# Patient Record
Sex: Female | Born: 1955
Health system: Southern US, Community
[De-identification: ages and names within clinical notes are randomized; demographics above are authoritative.]

## PROBLEM LIST (undated history)

## (undated) DIAGNOSIS — Z79890 Hormone replacement therapy (postmenopausal): Secondary | ICD-10-CM

## (undated) HISTORY — DX: Hormone replacement therapy: Z79.890

## (undated) HISTORY — PX: COLONOSCOPY: SHX174

---

## 2000-08-18 ENCOUNTER — Other Ambulatory Visit: Admission: RE | Admit: 2000-08-18 | Discharge: 2000-08-18 | Payer: Self-pay | Admitting: Obstetrics and Gynecology

## 2001-08-14 ENCOUNTER — Ambulatory Visit (HOSPITAL_COMMUNITY): Admission: RE | Admit: 2001-08-14 | Discharge: 2001-08-14 | Payer: Self-pay | Admitting: Obstetrics and Gynecology

## 2001-08-14 ENCOUNTER — Encounter: Payer: Self-pay | Admitting: Obstetrics and Gynecology

## 2003-02-24 ENCOUNTER — Ambulatory Visit (HOSPITAL_COMMUNITY): Admission: RE | Admit: 2003-02-24 | Discharge: 2003-02-24 | Payer: Self-pay | Admitting: Obstetrics and Gynecology

## 2004-07-15 ENCOUNTER — Ambulatory Visit (HOSPITAL_COMMUNITY): Admission: RE | Admit: 2004-07-15 | Discharge: 2004-07-15 | Payer: Self-pay | Admitting: Obstetrics and Gynecology

## 2005-07-13 ENCOUNTER — Ambulatory Visit (HOSPITAL_COMMUNITY): Admission: RE | Admit: 2005-07-13 | Discharge: 2005-07-13 | Payer: Self-pay | Admitting: Internal Medicine

## 2005-07-13 ENCOUNTER — Ambulatory Visit: Payer: Self-pay | Admitting: Internal Medicine

## 2005-07-19 ENCOUNTER — Ambulatory Visit (HOSPITAL_COMMUNITY): Admission: RE | Admit: 2005-07-19 | Discharge: 2005-07-19 | Payer: Self-pay | Admitting: Obstetrics and Gynecology

## 2006-08-10 ENCOUNTER — Ambulatory Visit (HOSPITAL_COMMUNITY): Admission: RE | Admit: 2006-08-10 | Discharge: 2006-08-10 | Payer: Self-pay | Admitting: Obstetrics and Gynecology

## 2007-08-08 ENCOUNTER — Other Ambulatory Visit: Admission: RE | Admit: 2007-08-08 | Discharge: 2007-08-08 | Payer: Self-pay | Admitting: Obstetrics and Gynecology

## 2007-08-13 ENCOUNTER — Ambulatory Visit (HOSPITAL_COMMUNITY): Admission: RE | Admit: 2007-08-13 | Discharge: 2007-08-13 | Payer: Self-pay | Admitting: Obstetrics and Gynecology

## 2008-08-13 ENCOUNTER — Ambulatory Visit (HOSPITAL_COMMUNITY): Admission: RE | Admit: 2008-08-13 | Discharge: 2008-08-13 | Payer: Self-pay | Admitting: Obstetrics and Gynecology

## 2008-09-11 ENCOUNTER — Other Ambulatory Visit: Admission: RE | Admit: 2008-09-11 | Discharge: 2008-09-11 | Payer: Self-pay | Admitting: Obstetrics and Gynecology

## 2009-08-24 ENCOUNTER — Ambulatory Visit (HOSPITAL_COMMUNITY): Admission: RE | Admit: 2009-08-24 | Discharge: 2009-08-24 | Payer: Self-pay | Admitting: Obstetrics and Gynecology

## 2009-11-06 ENCOUNTER — Other Ambulatory Visit: Admission: RE | Admit: 2009-11-06 | Discharge: 2009-11-06 | Payer: Self-pay | Admitting: Obstetrics and Gynecology

## 2010-06-11 NOTE — Op Note (Signed)
NAME:  Whitney Alvarez, Whitney Alvarez               ACCOUNT NO.:  [id number]   MEDICAL RECORD NO.:  [id number]          PATIENT TYPE:  AMB   LOCATION:  DAY                           FACILITY:  [facility]   PHYSICIAN:  Lionel December, M.D.    DATE OF BIRTH:  May 31, 1955   DATE OF PROCEDURE:  07/13/2005  DATE OF DISCHARGE:                                 OPERATIVE REPORT   PROCEDURE:  Colonoscopy.   INDICATIONS:  Cindy is a 55 year old Caucasian female who is undergoing  screening colonoscopy.  Family history is negative for colon carcinoma in  first-degree relatives.  The procedure risks were reviewed the patient,  informed consent was obtained.   MEDICATIONS FOR CONSCIOUS SEDATION:  Demerol 25 mg IV, Versed 5 mg IV.   FINDINGS:  Procedure performed in endoscopy suite.  The patient's vital  signs and O2 saturation were monitored during the procedure and remained  stable.  The patient was placed in the left lateral recumbent position.  Rectal examination was performed.  No abnormality noted on external or  digital exam.  The Olympus video scope was placed in the rectum and advanced  under vision into sigmoid colon and beyond.  Preparation was excellent.  The  scope was passed into cecum, which was identified by appendiceal orifice and  ileocecal valve.  Pictures taken for the record.  As the scope was  withdrawn, colonic mucosa was once again carefully examined and was normal  throughout.  Rectal mucosa similarly was normal.  The scope was retroflexed  to examine anorectal junction and small hemorrhoids were noted below the  dentate line.  Endoscope was straightened and withdrawn.  The patient  tolerated the procedure well.   FINAL DIAGNOSIS:  Small external hemorrhoids, otherwise normal colonoscopy.   RECOMMENDATIONS:  1.  Yearly Hemoccults.  2.  She may consider next screening exam in 10 years from now.      Lionel December, M.D.  Electronically Signed     NR/MEDQ  D:  07/13/2005  T:   07/13/2005  Job:  [id number]   cc:   Patrica Duel, M.D.  Fax: [phone]

## 2011-02-17 ENCOUNTER — Other Ambulatory Visit (HOSPITAL_COMMUNITY)
Admission: RE | Admit: 2011-02-17 | Discharge: 2011-02-17 | Disposition: A | Discharge status: Home / Self Care | Payer: BC Managed Care – PPO | Source: Ambulatory Visit | Attending: Obstetrics and Gynecology | Admitting: Obstetrics and Gynecology

## 2011-02-17 ENCOUNTER — Other Ambulatory Visit: Payer: Self-pay | Admitting: Adult Health

## 2011-02-17 DIAGNOSIS — Z01419 Encounter for gynecological examination (general) (routine) without abnormal findings: Secondary | ICD-10-CM | POA: Insufficient documentation

## 2011-04-25 ENCOUNTER — Other Ambulatory Visit (HOSPITAL_COMMUNITY): Payer: Self-pay | Admitting: Internal Medicine

## 2011-04-25 DIAGNOSIS — Z139 Encounter for screening, unspecified: Secondary | ICD-10-CM

## 2011-04-28 ENCOUNTER — Ambulatory Visit (HOSPITAL_COMMUNITY)
Admission: RE | Admit: 2011-04-28 | Discharge: 2011-04-28 | Disposition: A | Discharge status: Home / Self Care | Payer: BC Managed Care – PPO | Source: Ambulatory Visit | Attending: Internal Medicine | Admitting: Internal Medicine

## 2011-04-28 DIAGNOSIS — Z1231 Encounter for screening mammogram for malignant neoplasm of breast: Secondary | ICD-10-CM | POA: Insufficient documentation

## 2011-04-28 DIAGNOSIS — Z139 Encounter for screening, unspecified: Secondary | ICD-10-CM

## 2012-03-01 ENCOUNTER — Other Ambulatory Visit: Payer: Self-pay | Admitting: Adult Health

## 2012-03-01 ENCOUNTER — Other Ambulatory Visit (HOSPITAL_COMMUNITY)
Admission: RE | Admit: 2012-03-01 | Discharge: 2012-03-01 | Disposition: A | Discharge status: Home / Self Care | Payer: BC Managed Care – PPO | Source: Ambulatory Visit | Attending: Obstetrics and Gynecology | Admitting: Obstetrics and Gynecology

## 2012-03-01 DIAGNOSIS — Z1151 Encounter for screening for human papillomavirus (HPV): Secondary | ICD-10-CM | POA: Insufficient documentation

## 2012-03-01 DIAGNOSIS — Z01419 Encounter for gynecological examination (general) (routine) without abnormal findings: Secondary | ICD-10-CM | POA: Insufficient documentation

## 2012-05-24 ENCOUNTER — Other Ambulatory Visit (HOSPITAL_COMMUNITY): Payer: Self-pay | Admitting: Internal Medicine

## 2012-05-24 ENCOUNTER — Ambulatory Visit (HOSPITAL_COMMUNITY)
Admission: RE | Admit: 2012-05-24 | Discharge: 2012-05-24 | Disposition: A | Discharge status: Home / Self Care | Payer: BC Managed Care – PPO | Source: Ambulatory Visit | Attending: Internal Medicine | Admitting: Internal Medicine

## 2012-05-24 DIAGNOSIS — W208XXA Other cause of strike by thrown, projected or falling object, initial encounter: Secondary | ICD-10-CM | POA: Insufficient documentation

## 2012-05-24 DIAGNOSIS — R52 Pain, unspecified: Secondary | ICD-10-CM

## 2012-05-24 DIAGNOSIS — M79609 Pain in unspecified limb: Secondary | ICD-10-CM | POA: Insufficient documentation

## 2012-05-24 DIAGNOSIS — S99929A Unspecified injury of unspecified foot, initial encounter: Secondary | ICD-10-CM | POA: Insufficient documentation

## 2012-05-24 DIAGNOSIS — S8990XA Unspecified injury of unspecified lower leg, initial encounter: Secondary | ICD-10-CM | POA: Insufficient documentation

## 2012-11-08 ENCOUNTER — Other Ambulatory Visit: Payer: Self-pay | Admitting: Podiatry

## 2012-11-08 NOTE — Addendum Note (Signed)
Addended by: Ferman Hamming on: 11/08/2012 11:37 AM   Modules accepted: Orders

## 2012-12-04 ENCOUNTER — Encounter (HOSPITAL_COMMUNITY): Payer: Self-pay

## 2012-12-04 ENCOUNTER — Ambulatory Visit (HOSPITAL_COMMUNITY)
Admission: RE | Admit: 2012-12-04 | Discharge: 2012-12-04 | Disposition: A | Discharge status: Home / Self Care | Payer: BC Managed Care – PPO | Source: Ambulatory Visit | Attending: Podiatry | Admitting: Podiatry

## 2012-12-04 ENCOUNTER — Encounter (HOSPITAL_COMMUNITY)
Admission: RE | Admit: 2012-12-04 | Discharge: 2012-12-04 | Disposition: A | Discharge status: Home / Self Care | Payer: BC Managed Care – PPO | Source: Ambulatory Visit | Attending: Podiatry | Admitting: Podiatry

## 2012-12-04 ENCOUNTER — Encounter (HOSPITAL_COMMUNITY): Payer: Self-pay | Admitting: Pharmacy Technician

## 2012-12-04 DIAGNOSIS — M2010 Hallux valgus (acquired), unspecified foot: Secondary | ICD-10-CM | POA: Insufficient documentation

## 2012-12-04 DIAGNOSIS — Z01818 Encounter for other preprocedural examination: Secondary | ICD-10-CM | POA: Insufficient documentation

## 2012-12-04 DIAGNOSIS — Z01812 Encounter for preprocedural laboratory examination: Secondary | ICD-10-CM | POA: Insufficient documentation

## 2012-12-04 LAB — BASIC METABOLIC PANEL
BUN: 15 mg/dL (ref 6–23)
Chloride: 105 mEq/L (ref 96–112)
GFR calc Af Amer: >90 mL/min (ref >90)
GFR calc non Af Amer: >90 mL/min (ref >90)
Potassium: 4.5 mEq/L (ref 3.5–5.1)

## 2012-12-04 LAB — HEMOGLOBIN AND HEMATOCRIT, BLOOD
HCT: 38.2 % (ref 36.0–46.0)
Hemoglobin: 13.1 g/dL (ref 12.0–15.0)

## 2012-12-04 NOTE — Patient Instructions (Signed)
[facility]  12/04/2012   Your procedure is scheduled on:  12/13/2012  Report to [facility] at  615  AM.  Call this number if you have problems the morning of surgery: [phone]   Remember:   Do not eat food or drink liquids after midnight.   Take these medicines the morning of surgery with A SIP OF WATER: none   Do not wear jewelry, make-up or nail polish.  Do not wear lotions, powders, or perfumes.   Do not shave 48 hours prior to surgery. Men may shave face and neck.  Do not bring valuables to the hospital.  [facility] is not responsible for any belongings or valuables.               Contacts, dentures or bridgework may not be worn into surgery.  Leave suitcase in the car. After surgery it may be brought to your room.  For patients admitted to the hospital, discharge time is determined by your treatment team.               Patients discharged the day of surgery will not be allowed to drive home.  Name and phone number of your driver: family  Special Instructions: Shower using CHG 2 nights before surgery and the night before surgery.  If you shower the day of surgery use CHG.  Use special wash - you have one bottle of CHG for all showers.  You should use approximately 1/3 of the bottle for each shower.   Please read over the following fact sheets that you were given: Pain Booklet, Coughing and Deep Breathing, Surgical Site Infection Prevention, Anesthesia Post-op Instructions and Care and Recovery After Surgery Bunionectomy A bunionectomy is surgery to remove a bunion. A bunion is an enlargement of the joint at the base of the big toe. It is made up of bone and soft tissue on the inside part of the joint. Over time, a painful lump appears on the inside of the joint. The big toe begins to point inward toward the second toe. New bone growth can occur and a bone spur may form. The pain eventually causes difficulty walking. A bunion usually results from inflammation  caused by the irritation of poorly fitting shoes. It often begins later in life. A bunionectomy is performed when nonsurgical treatment no longer works. When surgery is needed, the extent of the procedure will depend on the degree of deformity of the foot. Your surgeon will discuss with you the different procedures and what will work best for you depending on your age and health. LET YOUR CAREGIVER KNOW ABOUT:   Previous problems with anesthetics or medicines used to numb the skin.  Allergies to dyes, iodine, foods, and/or latex.  Medicines taken including herbs, eye drops, prescription medicines (especially medicines used to "thin the blood"), aspirin and other over-the-counter medicines, and steroids (by mouth or as a cream).  History of bleeding or blood problems.  Possibility of pregnancy, if this applies.  History of blood clots in your legs and/or lungs .  Previous surgery.  Other important health problems. RISKS AND COMPLICATIONS   Infection.  Pain.  Nerve damage.  Possibility that the bunion will recur. BEFORE THE PROCEDURE  You should be present 60 minutes prior to your procedure or as directed.  PROCEDURE  Surgery is often done so that you can go home the same day (outpatient). It may be done in a hospital or in an  outpatient surgical center. An anesthetic will be used to help you sleep during the procedure. Sometimes, a spinal anesthetic is used to make you numb below the waist. A cut (incision) is made over the swollen area at the first joint of the big toe. The enlarged lump will be removed. If there is a need to reposition the bones of the big toe, this may require more than 1 incision. The bone itself may need to be cut. Screws and wires may be used in the repair. These can be removed at a later date. In severe cases, the entire joint may need to be removed and a joint replacement inserted. When done, the incision is closed with stitches (sutures). Skin adhesive strips  may be added for reinforcement. They help hold the incision closed.  AFTER THE PROCEDURE  Compression bandages (dressings) are then wrapped around the wound. This helps to keep the foot in alignment and reduce swelling. Your foot will be monitored for bleeding and swelling. You will need to stay for a few hours in the recovery area before being discharged. This allows time for the anesthesia to wear off. You will be discharged home when you are awake, stable, and doing well. HOME CARE INSTRUCTIONS   You can expect to return to normal activities within 6 to 8 weeks after surgery. The foot is at increased risk for swelling for several months. When you can expect to bear weight on the operated foot will depend on the extent of your surgery. The milder the deformity, the less tissue is removed and the sooner the return to normal activity level. During the recovery period, a special shoe, boot, or cast may be worn to accommodate the surgical bandage and to help provide stability to the foot.  Once you are home, an ice pack applied to the operative site may help with discomfort and keep swelling down. Stop using the ice if it causes discomfort.  Keep your feet raised (elevated) when possible to lessen swelling.  If you have an elastic bandage on your foot and you have numbness, tingling, or your foot becomes cold and blue, adjust the bandage to make it comfortable.  Change dressings as directed.  Keep the wound dry and clean. The wound may be washed gently with soap and water. Gently blot dry without rubbing. Do not take baths or use swimming pools or hot tubs for 10 days, or as instructed by your caregiver.  Only take over-the-counter or prescription medicines for pain, discomfort, or fever as directed by your caregiver.  You may continue a normal diet as directed.  For activity, use crutches with no weight bearing or your orthopedic shoe as directed. Continue to use crutches or a cane as directed  until you can stand without causing pain. SEEK MEDICAL CARE IF:   You have redness, swelling, bruising, or increasing pain in the wound.  There is pus coming from the wound.  You have drainage from a wound lasting longer than 1 day.  You have an oral temperature above 102 F (38.9 C).  You notice a bad smell coming from the wound or dressing.  The wound breaks open after sutures have been removed.  You develop dizzy episodes or fainting while standing.  You have persistent nausea or vomiting.  Your toes become cold.  Pain is not relieved with medicines. SEEK IMMEDIATE MEDICAL CARE IF:   You develop a rash.  You have difficulty breathing.  You develop any reaction or side effects to medicines  given.  Your toes are numb or blue, or you have severe pain. MAKE SURE YOU:   Understand these instructions.  Will watch your condition.  Will get help right away if you are not doing well or get worse. Document Released: 12/24/2004 Document Revised: 04/04/2011 Document Reviewed: 01/29/2007 [facility] Patient Information 2014 [place], [place]. Bunion You have a bunion deformity of the feet. This is more common in women. It tends to be an inherited problem. Symptoms can include pain, swelling, and deformity around the great toe. Numbness and tingling may also be present. Your symptoms are often worsened by wearing shoes that cause pressure on the bunion. Changing the type of shoes you wear helps reduce symptoms. A wide shoe decreases pressure on the bunion. An arch support may be used if you have flat feet. Avoid shoes with heels higher than two inches. This puts more pressure on the bunion. X-rays may be helpful in evaluating the severity of the problem. Other foot problems often seen with bunions include corns, calluses, and hammer toes. If the deformity or pain is severe, surgical treatment may be necessary. Keep off your painful foot as much as possible until the pain is relieved. Call  your caregiver if your symptoms are worse.  SEEK IMMEDIATE MEDICAL CARE IF:  You have increased redness, pain, swelling, or other symptoms of infection. Document Released: 01/10/2005 Document Revised: 04/04/2011 Document Reviewed: 07/10/2006 [facility] Patient Information 2014 [place], [place]. PATIENT INSTRUCTIONS POST-ANESTHESIA  IMMEDIATELY FOLLOWING SURGERY:  Do not drive or operate machinery for the first twenty four hours after surgery.  Do not make any important decisions for twenty four hours after surgery or while taking narcotic pain medications or sedatives.  If you develop intractable nausea and vomiting or a severe headache please notify your doctor immediately.  FOLLOW-UP:  Please make an appointment with your surgeon as instructed. You do not need to follow up with anesthesia unless specifically instructed to do so.  WOUND CARE INSTRUCTIONS (if applicable):  Keep a dry clean dressing on the anesthesia/puncture wound site if there is drainage.  Once the wound has quit draining you may leave it open to air.  Generally you should leave the bandage intact for twenty four hours unless there is drainage.  If the epidural site drains for more than 36-48 hours please call the anesthesia department.  QUESTIONS?:  Please feel free to call your physician or the hospital operator if you have any questions, and they will be happy to assist you.

## 2012-12-11 ENCOUNTER — Encounter (HOSPITAL_COMMUNITY): Payer: Self-pay

## 2012-12-13 ENCOUNTER — Ambulatory Visit (HOSPITAL_COMMUNITY)
Admission: RE | Admit: 2012-12-13 | Discharge: 2012-12-13 | Disposition: A | Discharge status: Home / Self Care | Payer: BC Managed Care – PPO | Source: Ambulatory Visit | Attending: Podiatry | Admitting: Podiatry

## 2012-12-13 ENCOUNTER — Encounter (HOSPITAL_COMMUNITY): Payer: Self-pay | Admitting: *Deleted

## 2012-12-13 ENCOUNTER — Encounter (HOSPITAL_COMMUNITY): Payer: BC Managed Care – PPO | Admitting: Anesthesiology

## 2012-12-13 ENCOUNTER — Ambulatory Visit (HOSPITAL_COMMUNITY): Payer: BC Managed Care – PPO | Admitting: Anesthesiology

## 2012-12-13 ENCOUNTER — Encounter (HOSPITAL_COMMUNITY)
Admission: RE | Disposition: A | Discharge status: Home / Self Care | Payer: Self-pay | Source: Ambulatory Visit | Attending: Podiatry

## 2012-12-13 ENCOUNTER — Ambulatory Visit (HOSPITAL_COMMUNITY): Payer: BC Managed Care – PPO

## 2012-12-13 DIAGNOSIS — M2040 Other hammer toe(s) (acquired), unspecified foot: Secondary | ICD-10-CM | POA: Insufficient documentation

## 2012-12-13 DIAGNOSIS — M2012 Hallux valgus (acquired), left foot: Secondary | ICD-10-CM

## 2012-12-13 DIAGNOSIS — M19079 Primary osteoarthritis, unspecified ankle and foot: Secondary | ICD-10-CM | POA: Insufficient documentation

## 2012-12-13 DIAGNOSIS — M2010 Hallux valgus (acquired), unspecified foot: Secondary | ICD-10-CM | POA: Insufficient documentation

## 2012-12-13 HISTORY — PX: METATARSAL HEAD EXCISION: SHX5027

## 2012-12-13 HISTORY — PX: PROXIMAL INTERPHALANGEAL FUSION (PIP): SHX6043

## 2012-12-13 HISTORY — PX: BUNIONECTOMY: SHX129

## 2012-12-13 SURGERY — BUNIONECTOMY
Anesthesia: Monitor Anesthesia Care | Site: Toe | Laterality: Left | Wound class: Clean

## 2012-12-13 MED ORDER — PROPOFOL INFUSION 10 MG/ML OPTIME
INTRAVENOUS | Status: DC | PRN
  Administered 2012-12-13: 40 ug/kg/min via INTRAVENOUS
  Administered 2012-12-13: 100 ug/kg/min via INTRAVENOUS

## 2012-12-13 MED ORDER — PROPOFOL 10 MG/ML IV EMUL
INTRAVENOUS | Status: AC
  Filled 2012-12-13: qty 20

## 2012-12-13 MED ORDER — ONDANSETRON HCL 4 MG/2ML IJ SOLN: 4.0000 mg | Freq: Once | INTRAMUSCULAR | Status: DC | PRN

## 2012-12-13 MED ORDER — FENTANYL CITRATE 0.05 MG/ML IJ SOLN
INTRAMUSCULAR | Status: AC
  Filled 2012-12-13: qty 2

## 2012-12-13 MED ORDER — LIDOCAINE HCL (PF) 1 % IJ SOLN
INTRAMUSCULAR | Status: AC
  Filled 2012-12-13: qty 30

## 2012-12-13 MED ORDER — MIDAZOLAM HCL 5 MG/5ML IJ SOLN
INTRAMUSCULAR | Status: DC | PRN
  Administered 2012-12-13 (×2): 1 mg via INTRAVENOUS

## 2012-12-13 MED ORDER — MIDAZOLAM HCL 2 MG/2ML IJ SOLN
INTRAMUSCULAR | Status: AC
  Filled 2012-12-13: qty 2

## 2012-12-13 MED ORDER — SODIUM CHLORIDE 0.9 % IR SOLN
Status: DC | PRN
  Administered 2012-12-13: 1000 mL

## 2012-12-13 MED ORDER — PROPOFOL 10 MG/ML IV BOLUS
INTRAVENOUS | Status: DC | PRN
  Administered 2012-12-13: 20 mg via INTRAVENOUS

## 2012-12-13 MED ORDER — CEFAZOLIN SODIUM-DEXTROSE 2-3 GM-% IV SOLR
INTRAVENOUS | Status: AC
  Filled 2012-12-13: qty 50

## 2012-12-13 MED ORDER — BUPIVACAINE HCL (PF) 0.5 % IJ SOLN
INTRAMUSCULAR | Status: AC
  Filled 2012-12-13: qty 30

## 2012-12-13 MED ORDER — FENTANYL CITRATE 0.05 MG/ML IJ SOLN
25.0000 ug | INTRAMUSCULAR | Status: AC
  Administered 2012-12-13: 25 ug via INTRAVENOUS

## 2012-12-13 MED ORDER — LACTATED RINGERS IV SOLN
INTRAVENOUS | Status: DC
  Administered 2012-12-13: 1000 mL via INTRAVENOUS

## 2012-12-13 MED ORDER — MIDAZOLAM HCL 2 MG/2ML IJ SOLN
1.0000 mg | INTRAMUSCULAR | Status: DC | PRN
  Administered 2012-12-13: 2 mg via INTRAVENOUS

## 2012-12-13 MED ORDER — FENTANYL CITRATE 0.05 MG/ML IJ SOLN
INTRAMUSCULAR | Status: DC | PRN
  Administered 2012-12-13 (×3): 25 ug via INTRAVENOUS

## 2012-12-13 MED ORDER — FENTANYL CITRATE 0.05 MG/ML IJ SOLN: 25.0000 ug | INTRAMUSCULAR | Status: DC | PRN

## 2012-12-13 MED ORDER — BUPIVACAINE HCL (PF) 0.5 % IJ SOLN
INTRAMUSCULAR | Status: DC | PRN
  Administered 2012-12-13: 10 mL
  Administered 2012-12-13: 20 mL

## 2012-12-13 MED ORDER — CEFAZOLIN SODIUM-DEXTROSE 2-3 GM-% IV SOLR
2.0000 g | INTRAVENOUS | Status: AC
  Administered 2012-12-13: 2 g via INTRAVENOUS

## 2012-12-13 SURGICAL SUPPLY — 78 items
APL SKNCLS STERI-STRIP NONHPOA (GAUZE/BANDAGES/DRESSINGS) ×2
BAG HAMPER (MISCELLANEOUS) ×3 IMPLANT
BANDAGE CONFORM 2  STR LF (GAUZE/BANDAGES/DRESSINGS) ×3 IMPLANT
BANDAGE ELASTIC 4 VELCRO NS (GAUZE/BANDAGES/DRESSINGS) ×3 IMPLANT
BANDAGE ELASTIC 6 VELCRO NS (GAUZE/BANDAGES/DRESSINGS) ×3 IMPLANT
BANDAGE ESMARK 4X12 BL STRL LF (DISPOSABLE) ×2 IMPLANT
BANDAGE GAUZE ELAST BULKY 4 IN (GAUZE/BANDAGES/DRESSINGS) ×3 IMPLANT
BENZOIN TINCTURE PRP APPL 2/3 (GAUZE/BANDAGES/DRESSINGS) ×3 IMPLANT
BIT DRILL MICR ACTRK 2 LNG PRF (BIT) IMPLANT
BLADE AVERAGE 25X9 (BLADE) ×3 IMPLANT
BLADE OSC/SAG 11.5X5.5X.38 (BLADE) ×1 IMPLANT
BLADE OSC/SAGITTAL MD 5.5X18 (BLADE) ×2 IMPLANT
BLADE OSC/SAGITTAL MD 9X18.5 (BLADE) IMPLANT
BLADE SURG 15 STRL LF DISP TIS (BLADE) ×4 IMPLANT
BLADE SURG 15 STRL SS (BLADE) ×9
BNDG CMPR 12X4 ELC STRL LF (DISPOSABLE) ×2
BNDG ESMARK 4X12 BLUE STRL LF (DISPOSABLE) ×3
CAP PIN PROTECTOR ORTHO WHT (CAP) IMPLANT
CHLORAPREP W/TINT 26ML (MISCELLANEOUS) ×4 IMPLANT
CLOTH BEACON ORANGE TIMEOUT ST (SAFETY) ×3 IMPLANT
COVER LIGHT HANDLE STERIS (MISCELLANEOUS) ×6 IMPLANT
CUFF TOURN SGL LL 12 (TOURNIQUET CUFF) ×2 IMPLANT
CUFF TOURNIQUET SINGLE 18IN (TOURNIQUET CUFF) ×3 IMPLANT
CUFF TOURNIQUET SINGLE 24IN (TOURNIQUET CUFF) ×2 IMPLANT
DECANTER SPIKE VIAL GLASS SM (MISCELLANEOUS) ×3 IMPLANT
DRAIN TLS ROUND 10FR (DRAIN) IMPLANT
DRAPE OEC MINIVIEW 54X84 (DRAPES) ×3 IMPLANT
DRILL BIT ALLOFIX 2.0MM (BIT) IMPLANT
DRILL MICRO ACUTRAK 2 LNG PROF (BIT) ×3
DRSG ADAPTIC 3X8 NADH LF (GAUZE/BANDAGES/DRESSINGS) ×3 IMPLANT
DURA STEPPER LG (CAST SUPPLIES) IMPLANT
DURA STEPPER MED (CAST SUPPLIES) ×1 IMPLANT
DURA STEPPER SML (CAST SUPPLIES) IMPLANT
DURA STEPPER XL (SOFTGOODS) IMPLANT
ELECT REM PT RETURN 9FT ADLT (ELECTROSURGICAL) ×3
ELECTRODE REM PT RTRN 9FT ADLT (ELECTROSURGICAL) ×2 IMPLANT
FORMALIN 10 PREFIL 120ML (MISCELLANEOUS) ×1 IMPLANT
FORMALIN 10 PREFIL 480ML (MISCELLANEOUS) ×2 IMPLANT
GAUZE KERLIX 2  STERILE LF (GAUZE/BANDAGES/DRESSINGS) ×1 IMPLANT
GLOVE BIO SURGEON STRL SZ7.5 (GLOVE) ×3 IMPLANT
GLOVE BIOGEL PI IND STRL 7.0 (GLOVE) IMPLANT
GLOVE BIOGEL PI INDICATOR 7.0 (GLOVE) ×2
GLOVE ECLIPSE 6.5 STRL STRAW (GLOVE) ×2 IMPLANT
GLOVE EXAM NITRILE MD LF STRL (GLOVE) ×1 IMPLANT
GOWN STRL REIN XL XLG (GOWN DISPOSABLE) ×9 IMPLANT
GUIDEWIRE ORTHO MICROSHT  ACUT (WIRE) ×1
GUIDEWIRE ORTHO MICROSHT .035 (WIRE) IMPLANT
IMPLANT SMARTTOE ARTHRO 19M (Toe) ×1 IMPLANT
K-WIRE 6 (WIRE)
KIT ROOM TURNOVER AP CYSTO (KITS) ×3 IMPLANT
KIT ROOM TURNOVER APOR (KITS) ×3 IMPLANT
KWIRE 6 (WIRE) IMPLANT
MANIFOLD NEPTUNE II (INSTRUMENTS) ×3 IMPLANT
MARKER SKIN DUAL TIP RULER LAB (MISCELLANEOUS) ×3 IMPLANT
NDL HYPO 18GX1.5 BLUNT FILL (NEEDLE) ×2 IMPLANT
NDL HYPO 27GX1-1/4 (NEEDLE) ×8 IMPLANT
NEEDLE HYPO 18GX1.5 BLUNT FILL (NEEDLE) ×3 IMPLANT
NEEDLE HYPO 27GX1-1/4 (NEEDLE) ×9 IMPLANT
NS IRRIG 1000ML POUR BTL (IV SOLUTION) ×3 IMPLANT
PACK BASIC LIMB (CUSTOM PROCEDURE TRAY) ×3 IMPLANT
PAD ARMBOARD 7.5X6 YLW CONV (MISCELLANEOUS) ×3 IMPLANT
PIN CAPS ORTHO GREEN .062 (PIN) IMPLANT
RASP SM TEAR CROSS CUT (RASP) ×3 IMPLANT
SCREW ACUTRAK 2 MICRO 20MM (Screw) ×1 IMPLANT
SET BASIN LINEN APH (SET/KITS/TRAYS/PACK) ×3 IMPLANT
SPONGE GAUZE 4X4 12PLY (GAUZE/BANDAGES/DRESSINGS) ×3 IMPLANT
SPONGE LAP 18X18 X RAY DECT (DISPOSABLE) ×3 IMPLANT
STRIP CLOSURE SKIN 1/2X4 (GAUZE/BANDAGES/DRESSINGS) ×5 IMPLANT
SUT BONE WAX W31G (SUTURE) ×2 IMPLANT
SUT MON AB 5-0 PS2 18 (SUTURE) ×4 IMPLANT
SUT PROLENE 4 0 PS 2 18 (SUTURE) ×4 IMPLANT
SUT VIC AB 2-0 CT2 27 (SUTURE) ×3 IMPLANT
SUT VIC AB 3-0 SH 27 (SUTURE) ×3
SUT VIC AB 3-0 SH 27X BRD (SUTURE) ×2 IMPLANT
SUT VIC AB 4-0 PS2 27 (SUTURE) ×6 IMPLANT
SUT VICRYL AB 3-0 FS1 BRD 27IN (SUTURE) ×3 IMPLANT
SYR CONTROL 10ML LL (SYRINGE) ×9 IMPLANT
WATER STERILE IRR 1000ML POUR (IV SOLUTION) ×3 IMPLANT

## 2012-12-13 NOTE — H&P (Signed)
HISTORY AND PHYSICAL INTERVAL NOTE:  12/13/2012  7:25 AM  Whitney Alvarez  has presented today for surgery, with the diagnosis of hallux valgus, and hammer toe deformity 2nd toe left foot, DJD 2nd mpj .  The various methods of treatment have been discussed with the patient.  No guarantees were given.  After consideration of risks, benefits and other options for treatment, the patient has consented to surgery.  I have reviewed the patients' chart and labs.    Patient Vitals for the past 24 hrs:  BP Temp Temp src Pulse Resp SpO2  12/13/12 0629 109/69 mmHg 97.6 F (36.4 C) Oral 74 21 98 %    A history and physical examination was performed in my office.  The patient was reexamined.  There have been no changes to this history and physical examination.  Dallas Schimke, DPM

## 2012-12-13 NOTE — Brief Op Note (Signed)
BRIEF OPERATIVE NOTE  SURGEON:   Dallas Schimke, DPM  OR STAFF:   Circulator: Cyndie Chime, RN; Rogene Houston, RN Scrub Person: Diana Eves, CST RN First Assistant: Lennox Pippins, RN   PREOPERATIVE DIAGNOSIS:   1.  Hallux valgus, left foot. 2.  Hammer toe deformity 2nd toe, left foot. 3.  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  POSTOPERATIVE DIAGNOSIS: Same  PROCEDURE: 1.  Austin bunionectomy, left foot. 2.  Arthrodesis of the proximal interphalangeal joint of the 2nd toe, left foot. 3.  Metatarsal head resection of the 2nd metatarsal, left foot.  ANESTHESIA:  Monitor Anesthesia Care   HEMOSTASIS:   Pneumatic ankle tourniquet set at 250 mmHg  ESTIMATED BLOOD LOSS:   Minimal (<5 cc)  MATERIALS USED:  1.  Accumed screw x 1. 2.  Stryker Smart Toe x 1.  INJECTABLES: Marcaine 0.5% plain; 30mL  PATHOLOGY:   2nd metatarsal head, left foot.  COMPLICATIONS:   None  INDICATIONS:  Painful knot along the top of the 2nd metatarsal head of the left foot.  She has been unable to tolerate shoes due to the knot and bunion deformity.  She has tried various shoes without relief.  DICTATION:  Note written in EPIC

## 2012-12-13 NOTE — Transfer of Care (Signed)
Immediate Anesthesia Transfer of Care Note  Patient: Whitney Alvarez  Procedure(s) Performed: Procedure(s): AUSTIN BUNIONECTOMY (Left) PROXIMAL INTERPHALANGEAL JOINT FUSION (PIP) 2ND TOE LEFT FOOT (Left) 2ND METATARSAL HEAD RESECTION LEFT FOOT (Left)  Patient Location: PACU  Anesthesia Type:MAC  Level of Consciousness: awake, alert  and oriented  Airway & Oxygen Therapy: Patient Spontanous Breathing  Post-op Assessment: Report given to PACU RN  Post vital signs: Reviewed and stable  Complications: No apparent anesthesia complications

## 2012-12-13 NOTE — Anesthesia Postprocedure Evaluation (Signed)
  Anesthesia Post-op Note  Patient: Whitney Alvarez  Procedure(s) Performed: Procedure(s): AUSTIN BUNIONECTOMY (Left) PROXIMAL INTERPHALANGEAL JOINT FUSION (PIP) 2ND TOE LEFT FOOT (Left) 2ND METATARSAL HEAD RESECTION LEFT FOOT (Left)  Patient Location: PACU  Anesthesia Type:MAC  Level of Consciousness: awake, alert  and oriented  Airway and Oxygen Therapy: Patient Spontanous Breathing  Post-op Pain: none  Post-op Assessment: Post-op Vital signs reviewed  Post-op Vital Signs: Reviewed and stable  Complications: No apparent anesthesia complications

## 2012-12-13 NOTE — Anesthesia Preprocedure Evaluation (Signed)
Anesthesia Evaluation  Patient identified by MRN, date of birth, ID band Patient awake    Reviewed: Allergy & Precautions, H&P , NPO status , Patient's Chart, lab work & pertinent test results  History of Anesthesia Complications Negative for: history of anesthetic complications  Airway Mallampati: I  TM Distance: >3 FB     Dental  (+) Teeth Intact   Pulmonary neg pulmonary ROS,    breath sounds clear to auscultation       Cardiovascular negative cardio ROS   Rhythm:Regular Rate:Normal     Neuro/Psych negative psych ROS   GI/Hepatic negative GI ROS,   Endo/Other    Renal/GU      Musculoskeletal   Abdominal   Peds  Hematology   Anesthesia Other Findings   Reproductive/Obstetrics                             Anesthesia Physical Anesthesia Plan  ASA: I  Anesthesia Plan: MAC   Post-op Pain Management:    Induction: Intravenous  Airway Management Planned: Nasal Cannula  Additional Equipment:   Intra-op Plan:   Post-operative Plan:   Informed Consent: I have reviewed the patients History and Physical, chart, labs and discussed the procedure including the risks, benefits and alternatives for the proposed anesthesia with the patient or authorized representative who has indicated his/her understanding and acceptance.     Plan Discussed with:   Anesthesia Plan Comments:         Anesthesia Quick Evaluation  

## 2012-12-13 NOTE — Op Note (Signed)
OPERATIVE NOTE  DATE OF PROCEDURE:  12/13/2012  SURGEON:   Dallas Schimke, DPM  OR STAFF:   Circulator: Cyndie Chime, RN; Rogene Houston, RN Scrub Person: Diana Eves, CST RN First Assistant: Lennox Pippins, RN   PREOPERATIVE DIAGNOSIS:   1.  Hallux valgus, left foot. 2.  Hammer toe deformity 2nd toe, left foot. 3.  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  POSTOPERATIVE DIAGNOSIS: Same  PROCEDURE: 1.  Austin bunionectomy, left foot. 2.  Arthrodesis of the proximal interphalangeal joint of the 2nd toe, left foot. 3.  Metatarsal head resection of the 2nd metatarsal, left foot.  ANESTHESIA:  Monitor Anesthesia Care   HEMOSTASIS:   Pneumatic ankle tourniquet set at 250 mmHg  ESTIMATED BLOOD LOSS:   Minimal (<5 cc)  MATERIALS USED:  1.  Accumed screw x 1. 2.  Stryker Smart Toe x 1.  INJECTABLES: Marcaine 0.5% plain; 30mL  PATHOLOGY:   2nd metatarsal head, left foot.  COMPLICATIONS:   None  INDICATIONS:  Painful knot along the top of the 2nd metatarsal head of the left foot.  She has been unable to tolerate shoes due to the knot and bunion deformity.  She has tried various shoes without relief.  DESCRIPTION OF THE PROCEDURE:   The patient was brought to the operating room and placed on the operative table in the supine position.  A pneumatic ankle tourniquet was applied to the patient's ankle.  Following sedation, the surgical site was anesthetized with 0.5% Marcaine plain.  The foot was then prepped, scrubbed, and draped in the usual sterile technique.  The foot was elevated, exsanguinated and the pneumatic ankle tourniquet inflated to 250 mmHg.    Attention was then directed to the dorsomedial aspect of the first metatarsal phalangeal joint where a 6-cm linear incision was made medial and parallel to the course of the extensor hallucis longus tendon.  The incision was deepened through subcutaneous tissues.   Vital neurovascular structures were identified and retracted.  All bleeders were identified and cauterized.  The incision was deepened to the level of the first metatarsal phalangeal joint capsule.  An inverted L-type capsulotomy was performed over the dorsal aspect of the first metatarsal phalangeal joint.  The capsular and periosteal structures were dissected free of their osseous attachments and reflected medially and laterally thus exposing the head of the first metatarsal at the operative site.  Utilizing a sagittal saw, the medial prominence was resected and passed from the operative field.  All rough edges were smoothed.    Attention was directed to the first interspace via the original skin incision.  Using sharp and blunt dissection, the fibular sesamoid was freed of its soft tissue attachments proximally, laterally and distally.  The conjoined tendon of the adductor hallucis muscle was identified and transected at its attachment to the base of the proximal phalanx of the hallux.  The lateral contracture present on the hallux was noted to be reduced and the sesamoid apparatus was noted to float into a more corrected medial position.    Attention was redirected to the medial aspect of the first metatarsal head where a through and through chevron osteotomy was created in the metaphyseal region of the first metatarsal bone using a sagittal bone saw.  The apex of the osteotomy pointed distally.  Upon completion of the osteotomy the capital fragment was distracted and shifted laterally into a more corrected position.  A k-wire from the [employer]uMed cannulated screw set  was inserted across the osteotomy site from a dorsal proximal to plantar distal direction.  An AccuMed cannulated screw was inserted across the k-wire.  Adequate compression was noted.  The position of the screw was confirmed with fluoroscopy and noted to be in acceptable alignment.    The remaining medial bone shelf was resected utilizing a  sagittal bone saw and passed from the operative field.  The area was copiously irrigated. The periosteal and capsular tissues were approximated with 2-0 Vicryl suture.  4-0 Vicryl was used to approximate the subcutaneous tissues. 4-0 Vicryl was used to approximate the skin in a subcuticular manner.    Attention was directed to the dorsal aspect of the second toe of the left foot.  A linear longitudinal incision was made overlying the second metatarsophalangeal joint and proximal interphalangeal joint.  Dissection was continued deep down to the level of the extensor digitorum longus tendon.  A transverse tenotomy capsulotomy was performed at the level of the proximal interphalangeal joint.  The tendon was reflected proximally thus exposing the second metatarsophalangeal joint.  The second metatarsal bone was hypertrophy dorsally consistent with the bony prominence noted preoperatively.  The central 60% of the articular surface along the distal aspect of the second metatarsal head was eroded.  An osteotomy was performed proximal to the surgical neck of the second metatarsal bone.  The head of the second metatarsal bone was freed of soft tissue attachments and passed from the operative field.  It was sent to pathology for evaluation.  Attention was directed to the proximal interphalangeal joint.  The articular surface from the head of the proximal phalanx was resected using a power bone saw.  The articular surface from the base of the middle phalanx was resected using a power bone saw.  Following standard principles and techniques a Stryker Smart toe implant size 19 was inserted across the proximal interphalangeal joint.  Position was confirmed with C-arm fluoroscopy.  The wound was irrigated with Amounts of sterile irrigant.  The extensor tendon was reapproximated using 4-0 vicryl in a simple suture technique.  The skin was reapproximated using 4-0 vicryl in a running subcuticular manner.  Incision closure was  reinforced with 4-0 prolene in a simple suture technique.  The patient tolerated the procedure well.  The patient was then transferred to PACU with vital signs stable and vascular status intact to all toes of the operative foot.  Following a period of postoperative monitoring, the patient will be discharged home.

## 2012-12-17 ENCOUNTER — Encounter (HOSPITAL_COMMUNITY): Payer: Self-pay | Admitting: Podiatry

## 2013-03-14 ENCOUNTER — Other Ambulatory Visit (HOSPITAL_COMMUNITY): Payer: Self-pay | Admitting: Internal Medicine

## 2013-03-14 DIAGNOSIS — Z1231 Encounter for screening mammogram for malignant neoplasm of breast: Secondary | ICD-10-CM

## 2013-03-19 ENCOUNTER — Ambulatory Visit (HOSPITAL_COMMUNITY)
Admission: RE | Admit: 2013-03-19 | Discharge: 2013-03-19 | Disposition: A | Discharge status: Home / Self Care | Payer: BC Managed Care – PPO | Source: Ambulatory Visit | Attending: Internal Medicine | Admitting: Internal Medicine

## 2013-03-19 DIAGNOSIS — Z1231 Encounter for screening mammogram for malignant neoplasm of breast: Secondary | ICD-10-CM

## 2013-03-20 ENCOUNTER — Other Ambulatory Visit: Payer: Self-pay | Admitting: Adult Health

## 2013-03-27 ENCOUNTER — Encounter: Payer: Self-pay | Admitting: Adult Health

## 2013-03-27 ENCOUNTER — Ambulatory Visit (INDEPENDENT_AMBULATORY_CARE_PROVIDER_SITE_OTHER): Payer: BC Managed Care – PPO | Admitting: Adult Health

## 2013-03-27 VITALS — BP 98/64 | HR 72 | Ht 66.0 in | Wt 150.0 lb

## 2013-03-27 DIAGNOSIS — Z01419 Encounter for gynecological examination (general) (routine) without abnormal findings: Secondary | ICD-10-CM

## 2013-03-27 DIAGNOSIS — Z79890 Hormone replacement therapy (postmenopausal): Secondary | ICD-10-CM

## 2013-03-27 HISTORY — DX: Hormone replacement therapy: Z79.890

## 2013-03-27 NOTE — Patient Instructions (Signed)
Physical in 1 year Mammogram yearly Colonoscopy per GI Labs at work 

## 2013-03-27 NOTE — Progress Notes (Signed)
Patient ID: Whitney Alvarez, female   DOB: 1955-01-29, 58 y.o.   MRN: [id number] History of Present Illness: Whitney Alvarez is a 58 year old white female divorced in for a physical, she had a normal pap with negative HPV 03/01/12.She is retired but teaches part time.   Current Medications, Allergies, Past Medical History, Past Surgical History, Family History and Social History were reviewed in [employer] record.     Review of Systems: Patient denies any headaches, blurred vision, shortness of breath, chest pain, abdominal pain, problems with bowel movements, urination, or intercourse, not having sex.No mood swings, has pain left foot where had bunionectomy earlier in year.Try frozen water bottle roll or roll tennis ball, has F/U with Dr Whitney Alvarez.    Physical Exam:BP 98/64  Pulse 72  Ht 5\' 6"  (1.676 m)  Wt 150 lb (68.04 kg)  BMI 24.22 kg/m2 General:  Well developed, well nourished, no acute distress Skin:  Warm and dry Neck:  Midline trachea, normal thyroid Lungs; Clear to auscultation bilaterally Breast:  No dominant palpable mass, retraction, or nipple discharge Cardiovascular: Regular rate and rhythm Abdomen:  Soft, non tender, no hepatosplenomegaly Pelvic:  External genitalia is normal in appearance.  The vagina is normal in appearance.  The cervix is atrophic.  Uterus is felt to be normal size, shape, and contour.  No adnexal masses or tenderness noted. Rectal: Good sphincter tone, no polyps, or hemorrhoids felt.  Hemoccult negative. Extremities:  No swelling or varicosities noted Psych:  No mood changes, alert and cooperative, seems happy   Impression: Yearly gyn exam no pap HRT    Plan: Physical in 1 year Mammogram yearly  Labs at school Colonoscopy per GI Continue HRT

## 2013-04-04 ENCOUNTER — Telehealth: Payer: Self-pay | Admitting: Adult Health

## 2013-04-04 NOTE — Telephone Encounter (Signed)
Had? About labs at work, answered

## 2013-08-23 ENCOUNTER — Telehealth: Payer: Self-pay | Admitting: Obstetrics & Gynecology

## 2013-08-23 ENCOUNTER — Ambulatory Visit (INDEPENDENT_AMBULATORY_CARE_PROVIDER_SITE_OTHER): Payer: BC Managed Care – PPO | Admitting: Obstetrics & Gynecology

## 2013-08-23 DIAGNOSIS — R350 Frequency of micturition: Secondary | ICD-10-CM

## 2013-08-23 LAB — POCT URINALYSIS DIPSTICK
GLUCOSE UA: NEGATIVE
KETONES UA: NEGATIVE
Nitrite, UA: POSITIVE
Protein, UA: NEGATIVE

## 2013-08-23 MED ORDER — CIPROFLOXACIN HCL 500 MG PO TABS: 500.0000 mg | ORAL_TABLET | Freq: Two times a day (BID) | ORAL | Status: DC

## 2013-08-23 NOTE — Telephone Encounter (Signed)
Pt states last night had chills with fever,  urinary urgency, notes blood on tissue when wipes after urination. Pt states thinks UTI. Pt to come in now to obtain urine sample for culture. Per Dr. Elonda Husky will give antibiotic.

## 2013-08-23 NOTE — Progress Notes (Signed)
Patient ID: Whitney Alvarez, female   DOB: 1955-05-01, 58 y.o.   MRN: [id number] Pt here with c/o chill and fever, urinary frequency. Urine dipstick reveal positive. Dr. Elonda Husky gave verbal order for Cipro 500 mg 1 tablet po BID #14. Urine sent out for culture.

## 2013-08-26 LAB — URINE CULTURE

## 2013-10-22 NOTE — Progress Notes (Signed)
Patient ID: Whitney Alvarez, female   DOB: July 08, 1955, 58 y.o.   MRN: [id number] ua noted, most likely an uncomplicated uti  Culture sent  Ciprofloxacin 500 BID x 7days

## 2013-11-25 ENCOUNTER — Encounter: Payer: Self-pay | Admitting: Adult Health

## 2014-05-12 ENCOUNTER — Other Ambulatory Visit: Payer: Self-pay | Admitting: Adult Health

## 2014-09-24 ENCOUNTER — Other Ambulatory Visit (HOSPITAL_COMMUNITY): Payer: Self-pay | Admitting: Internal Medicine

## 2014-09-24 DIAGNOSIS — Z1231 Encounter for screening mammogram for malignant neoplasm of breast: Secondary | ICD-10-CM

## 2014-10-06 ENCOUNTER — Ambulatory Visit (HOSPITAL_COMMUNITY)
Admission: RE | Admit: 2014-10-06 | Discharge: 2014-10-06 | Disposition: A | Discharge status: Home / Self Care | Payer: BC Managed Care – PPO | Source: Ambulatory Visit | Attending: Internal Medicine | Admitting: Internal Medicine

## 2014-10-06 DIAGNOSIS — Z1231 Encounter for screening mammogram for malignant neoplasm of breast: Secondary | ICD-10-CM | POA: Diagnosis not present

## 2014-12-01 ENCOUNTER — Telehealth: Payer: Self-pay | Admitting: Adult Health

## 2014-12-01 NOTE — Telephone Encounter (Signed)
Took patch off today and drug is out will have next weel,reassured it is OK but she may have hot flashes and night sweats till gets new patch

## 2015-05-31 LAB — BASIC METABOLIC PANEL: Glucose: 110 mg/dL

## 2015-07-02 NOTE — Patient Instructions (Signed)
EMERALD SANPEDRO  07/02/2015     @PREFPERIOPPHARMACY @   Your procedure is scheduled on 07/08/2015.  Report to [facility] at 7:00 A.M.  Call this number if you have problems the morning of surgery:  [phone]   Remember:  Do not eat food or drink liquids after midnight.  Take these medicines the morning of surgery with A SIP OF WATER : none   Do not wear jewelry, make-up or nail polish.  Do not wear lotions, powders, or perfumes.  You may wear deodorant.  Do not shave 48 hours prior to surgery.  Men may shave face and neck.  Do not bring valuables to the hospital.  [facility] is not responsible for any belongings or valuables.  Contacts, dentures or bridgework may not be worn into surgery.  Leave your suitcase in the car.  After surgery it may be brought to your room.  For patients admitted to the hospital, discharge time will be determined by your treatment team.  Patients discharged the day of surgery will not be allowed to drive home.   Name and phone number of your driver:   family Special instructions:  n/a  Please read over the following fact sheets that you were given. Care and Recovery After Surgery    Bunionectomy A bunionectomy is a surgical procedure to remove a bunion. A bunion is a visible bump of bone on the inside of your foot where your big toe meets the rest of your foot. A bunion can develop when pressure turns this bone (first metatarsal) toward the other toes. Shoes that are too tight are the most common cause of bunions. Bunions can also be caused by diseases, such as arthritis and polio. You may need a bunionectomy if your bunion is very large and painful or it affects your ability to walk. LET [facility] CARE PROVIDER KNOW ABOUT:  Any allergies you have.  All medicines you are taking, including vitamins, herbs, eye drops, creams, and over-the-counter medicines.  Previous problems you or members of your family have had with the use of  anesthetics.  Any blood disorders you have.  Previous surgeries you have had.  Medical conditions you have. RISKS AND COMPLICATIONS  Generally, this is a safe procedure. However, problems may occur, including:  Infection.  Pain.  Nerve damage.  Bleeding or blood clots.  Reactions to medicines.  Numbness, stiffness, or arthritis in your toe.  Foot problems that continue even after the procedure. BEFORE THE PROCEDURE  Ask your health care provider about:  Changing or stopping your regular medicines. This is especially important if you are taking diabetes medicines or blood thinners.  Taking medicines such as aspirin and ibuprofen. These medicines can thin your blood. Do not take these medicines before your procedure if your health care provider instructs you not to.  Do not drink alcohol before the procedure as directed by your health care provider.  Do not use tobacco products, including cigarettes, chewing tobacco, or electronic cigarettes, before the procedure as directed by your health care provider. If you need help quitting, ask your health care provider.  Ask your health care provider what kind of medicine you will be given during your procedure. A bunionectomy may be done using one of these:  A medicine that numbs the area (local anesthetic).  A medicine that makes you go to sleep (general anesthetic). If you will be given general anesthetic, do not eat or drink anything after midnight on the night before the  procedure or as directed by your health care provider. PROCEDURE  An IV tube may be inserted into a vein.  You will be given local anesthetic or general anesthetic.  The surgeon will make a cut (incision) over the enlarged area at the first joint of the big toe. The surgeon will remove the bunion.  You may have more than one incision if any of the bones in your big toe need to be moved. A bone itself may need to be cut.  Sometimes the tissues around the  big toe may also need to be cut then tightened or loosened to reposition the toe.  Screws or other hardware may be used to keep your foot in thecorrect position.  The incision will be closed with stitches (sutures) and covered with adhesive strips or another type of bandage (dressing). AFTER THE PROCEDURE  You may spend some time in a recovery area.  Your blood pressure, heart rate, breathing rate, and blood oxygen level will be monitored often until the medicines you were given have worn off.   This information is not intended to replace advice given to you by your health care provider. Make sure you discuss any questions you have with your health care provider.   Document Released: 12/24/2004 Document Revised: 10/01/2014 Document Reviewed: 08/28/2013 Elsevier Interactive Patient Education [employer].

## 2015-07-03 ENCOUNTER — Other Ambulatory Visit (HOSPITAL_COMMUNITY): Payer: Self-pay | Admitting: Podiatry

## 2015-07-03 ENCOUNTER — Other Ambulatory Visit: Payer: Self-pay | Admitting: Podiatry

## 2015-07-03 ENCOUNTER — Encounter (HOSPITAL_COMMUNITY)
Admission: RE | Admit: 2015-07-03 | Discharge: 2015-07-03 | Disposition: A | Discharge status: Home / Self Care | Payer: BC Managed Care – PPO | Source: Ambulatory Visit | Attending: Podiatry | Admitting: Podiatry

## 2015-07-03 ENCOUNTER — Ambulatory Visit (HOSPITAL_COMMUNITY)
Admission: RE | Admit: 2015-07-03 | Discharge: 2015-07-03 | Disposition: A | Discharge status: Home / Self Care | Payer: BC Managed Care – PPO | Source: Ambulatory Visit | Attending: Podiatry | Admitting: Podiatry

## 2015-07-03 ENCOUNTER — Encounter (HOSPITAL_COMMUNITY): Payer: Self-pay

## 2015-07-03 DIAGNOSIS — M2012 Hallux valgus (acquired), left foot: Secondary | ICD-10-CM | POA: Insufficient documentation

## 2015-07-03 DIAGNOSIS — Z01818 Encounter for other preprocedural examination: Secondary | ICD-10-CM | POA: Insufficient documentation

## 2015-07-03 DIAGNOSIS — Z01810 Encounter for preprocedural cardiovascular examination: Secondary | ICD-10-CM | POA: Diagnosis not present

## 2015-07-03 LAB — BASIC METABOLIC PANEL
Anion gap: 5 (ref 5–15)
BUN: 13 mg/dL (ref 6–20)
CHLORIDE: 106 mmol/L (ref 101–111)
CO2: 29 mmol/L (ref 22–32)
CREATININE: 0.8 mg/dL (ref 0.44–1.00)
Calcium: 9.1 mg/dL (ref 8.9–10.3)
GFR calc Af Amer: >60 mL/min (ref >60)
GFR calc non Af Amer: >60 mL/min (ref >60)
GLUCOSE: 91 mg/dL (ref 65–99)
POTASSIUM: 4.2 mmol/L (ref 3.5–5.1)
Sodium: 140 mmol/L (ref 135–145)

## 2015-07-03 LAB — CBC WITH DIFFERENTIAL/PLATELET
Basophils Absolute: 0 10*3/uL (ref 0.0–0.1)
Basophils Relative: 0 %
EOS ABS: 0.1 10*3/uL (ref 0.0–0.7)
Eosinophils Relative: 2 %
HEMATOCRIT: 37.9 % (ref 36.0–46.0)
HEMOGLOBIN: 12.8 g/dL (ref 12.0–15.0)
LYMPHS ABS: 1.1 10*3/uL (ref 0.7–4.0)
Lymphocytes Relative: 21 %
MCH: 33.2 pg (ref 26.0–34.0)
MCHC: 33.8 g/dL (ref 30.0–36.0)
MCV: 98.4 fL (ref 78.0–100.0)
MONO ABS: 0.3 10*3/uL (ref 0.1–1.0)
MONOS PCT: 6 %
NEUTROS ABS: 3.6 10*3/uL (ref 1.7–7.7)
Neutrophils Relative %: 71 %
Platelets: 245 10*3/uL (ref 150–400)
RBC: 3.85 MIL/uL — AB (ref 3.87–5.11)
RDW: 12.9 % (ref 11.5–15.5)
WBC: 5 10*3/uL (ref 4.0–10.5)

## 2015-07-06 ENCOUNTER — Encounter (INDEPENDENT_AMBULATORY_CARE_PROVIDER_SITE_OTHER): Payer: Self-pay | Admitting: *Deleted

## 2015-07-08 ENCOUNTER — Ambulatory Visit (HOSPITAL_COMMUNITY): Payer: BC Managed Care – PPO | Admitting: Anesthesiology

## 2015-07-08 ENCOUNTER — Ambulatory Visit (HOSPITAL_COMMUNITY): Payer: BC Managed Care – PPO

## 2015-07-08 ENCOUNTER — Encounter (HOSPITAL_COMMUNITY)
Admission: RE | Disposition: A | Discharge status: Home / Self Care | Payer: Self-pay | Source: Ambulatory Visit | Attending: Podiatry

## 2015-07-08 ENCOUNTER — Ambulatory Visit (HOSPITAL_COMMUNITY)
Admission: RE | Admit: 2015-07-08 | Discharge: 2015-07-08 | Disposition: A | Discharge status: Home / Self Care | Payer: BC Managed Care – PPO | Source: Ambulatory Visit | Attending: Podiatry | Admitting: Podiatry

## 2015-07-08 ENCOUNTER — Encounter (HOSPITAL_COMMUNITY): Payer: Self-pay

## 2015-07-08 DIAGNOSIS — M205X2 Other deformities of toe(s) (acquired), left foot: Secondary | ICD-10-CM | POA: Insufficient documentation

## 2015-07-08 DIAGNOSIS — M2012 Hallux valgus (acquired), left foot: Secondary | ICD-10-CM | POA: Insufficient documentation

## 2015-07-08 DIAGNOSIS — Z9889 Other specified postprocedural states: Secondary | ICD-10-CM

## 2015-07-08 HISTORY — PX: AIKEN OSTEOTOMY: SHX6331

## 2015-07-08 HISTORY — PX: BUNIONECTOMY: SHX129

## 2015-07-08 HISTORY — PX: FOOT ARTHRODESIS: SHX1655

## 2015-07-08 SURGERY — BUNIONECTOMY
Anesthesia: Monitor Anesthesia Care | Laterality: Left

## 2015-07-08 MED ORDER — LACTATED RINGERS IV SOLN
INTRAVENOUS | Status: DC
  Administered 2015-07-08: 09:00:00 via INTRAVENOUS

## 2015-07-08 MED ORDER — LIDOCAINE HCL (PF) 1 % IJ SOLN
INTRAMUSCULAR | Status: AC
  Filled 2015-07-08: qty 30

## 2015-07-08 MED ORDER — SUCCINYLCHOLINE CHLORIDE 20 MG/ML IJ SOLN
INTRAMUSCULAR | Status: AC
  Filled 2015-07-08: qty 1

## 2015-07-08 MED ORDER — BUPIVACAINE HCL (PF) 0.5 % IJ SOLN
INTRAMUSCULAR | Status: AC
  Filled 2015-07-08: qty 30

## 2015-07-08 MED ORDER — CEFAZOLIN SODIUM-DEXTROSE 2-4 GM/100ML-% IV SOLN
2.0000 g | Freq: Once | INTRAVENOUS | Status: AC
  Administered 2015-07-08: 2 g via INTRAVENOUS
  Filled 2015-07-08: qty 100

## 2015-07-08 MED ORDER — LACTATED RINGERS IV SOLN
INTRAVENOUS | Status: DC | PRN
  Administered 2015-07-08 (×2): via INTRAVENOUS

## 2015-07-08 MED ORDER — ATROPINE SULFATE 1 MG/ML IJ SOLN
INTRAMUSCULAR | Status: AC
  Filled 2015-07-08: qty 1

## 2015-07-08 MED ORDER — PROPOFOL 10 MG/ML IV BOLUS
INTRAVENOUS | Status: AC
  Filled 2015-07-08: qty 20

## 2015-07-08 MED ORDER — MIDAZOLAM HCL 5 MG/5ML IJ SOLN
INTRAMUSCULAR | Status: DC | PRN
  Administered 2015-07-08: 2 mg via INTRAVENOUS

## 2015-07-08 MED ORDER — PROPOFOL 500 MG/50ML IV EMUL
INTRAVENOUS | Status: DC | PRN
  Administered 2015-07-08: 50 ug/kg/min via INTRAVENOUS
  Administered 2015-07-08 (×3): via INTRAVENOUS

## 2015-07-08 MED ORDER — ONDANSETRON HCL 4 MG/2ML IJ SOLN: 4.0000 mg | Freq: Once | INTRAMUSCULAR | Status: DC | PRN

## 2015-07-08 MED ORDER — MIDAZOLAM HCL 2 MG/2ML IJ SOLN
INTRAMUSCULAR | Status: AC
  Filled 2015-07-08: qty 2

## 2015-07-08 MED ORDER — EPHEDRINE SULFATE 50 MG/ML IJ SOLN
INTRAMUSCULAR | Status: AC
  Filled 2015-07-08: qty 1

## 2015-07-08 MED ORDER — LIDOCAINE HCL 1 % IJ SOLN
INTRAMUSCULAR | Status: DC | PRN
  Administered 2015-07-08: 10 mg via INTRADERMAL

## 2015-07-08 MED ORDER — BUPIVACAINE HCL (PF) 0.5 % IJ SOLN
INTRAMUSCULAR | Status: DC | PRN
  Administered 2015-07-08: 20 mL
  Administered 2015-07-08: 10 mL

## 2015-07-08 MED ORDER — SODIUM CHLORIDE 0.9 % IJ SOLN
INTRAMUSCULAR | Status: AC
  Filled 2015-07-08: qty 10

## 2015-07-08 MED ORDER — FENTANYL CITRATE (PF) 100 MCG/2ML IJ SOLN
INTRAMUSCULAR | Status: AC
  Filled 2015-07-08: qty 2

## 2015-07-08 MED ORDER — FENTANYL CITRATE (PF) 100 MCG/2ML IJ SOLN: 25.0000 ug | INTRAMUSCULAR | Status: DC | PRN

## 2015-07-08 MED ORDER — ONDANSETRON HCL 4 MG/2ML IJ SOLN
INTRAMUSCULAR | Status: AC
  Filled 2015-07-08: qty 2

## 2015-07-08 MED ORDER — FENTANYL CITRATE (PF) 100 MCG/2ML IJ SOLN
25.0000 ug | INTRAMUSCULAR | Status: AC
  Administered 2015-07-08 (×2): 25 ug via INTRAVENOUS

## 2015-07-08 MED ORDER — FENTANYL CITRATE (PF) 100 MCG/2ML IJ SOLN
INTRAMUSCULAR | Status: DC | PRN
  Administered 2015-07-08 (×4): 25 ug via INTRAVENOUS
  Administered 2015-07-08: 50 ug via INTRAVENOUS

## 2015-07-08 MED ORDER — LIDOCAINE HCL (PF) 1 % IJ SOLN
INTRAMUSCULAR | Status: AC
Start: 2015-07-08 — End: 2015-07-08
  Filled 2015-07-08: qty 30

## 2015-07-08 MED ORDER — PROPOFOL 10 MG/ML IV BOLUS
INTRAVENOUS | Status: DC | PRN
  Administered 2015-07-08 (×7): 10 mg via INTRAVENOUS

## 2015-07-08 MED ORDER — LIDOCAINE HCL (PF) 1 % IJ SOLN
INTRAMUSCULAR | Status: AC
  Filled 2015-07-08: qty 5

## 2015-07-08 MED ORDER — ONDANSETRON HCL 4 MG/2ML IJ SOLN
INTRAMUSCULAR | Status: DC | PRN
  Administered 2015-07-08: 4 mg via INTRAVENOUS

## 2015-07-08 MED ORDER — SODIUM CHLORIDE 0.9 % IR SOLN
Status: DC | PRN
  Administered 2015-07-08: 1000 mL

## 2015-07-08 MED ORDER — PHENYLEPHRINE 40 MCG/ML (10ML) SYRINGE FOR IV PUSH (FOR BLOOD PRESSURE SUPPORT)
PREFILLED_SYRINGE | INTRAVENOUS | Status: AC
  Filled 2015-07-08: qty 10

## 2015-07-08 MED ORDER — LIDOCAINE HCL (PF) 1 % IJ SOLN
INTRAMUSCULAR | Status: DC | PRN
  Administered 2015-07-08: 20 mL

## 2015-07-08 MED ORDER — MIDAZOLAM HCL 2 MG/2ML IJ SOLN
1.0000 mg | INTRAMUSCULAR | Status: DC | PRN
  Administered 2015-07-08: 2 mg via INTRAVENOUS

## 2015-07-08 SURGICAL SUPPLY — 67 items
APL SKNCLS STERI-STRIP NONHPOA (GAUZE/BANDAGES/DRESSINGS) ×1
BAG HAMPER (MISCELLANEOUS) ×3 IMPLANT
BANDAGE ELASTIC 4 LF NS (GAUZE/BANDAGES/DRESSINGS) ×2 IMPLANT
BANDAGE ELASTIC 4 VELCRO NS (GAUZE/BANDAGES/DRESSINGS) ×3 IMPLANT
BANDAGE ESMARK 4X12 BL STRL LF (DISPOSABLE) ×1 IMPLANT
BENZOIN TINCTURE PRP APPL 2/3 (GAUZE/BANDAGES/DRESSINGS) ×3 IMPLANT
BIT DRILL 2X3.5 HAND QK RELEAS (BIT) IMPLANT
BIT DRILL 2X3.5 QUICK RELEASE (BIT) ×3
BIT DRILL CANNULATED 4MM (BIT) IMPLANT
BLADE 15 SAFETY STRL DISP (BLADE) ×6 IMPLANT
BLADE AVERAGE 25MMX9MM (BLADE) ×1
BLADE AVERAGE 25X9 (BLADE) ×2 IMPLANT
BNDG CMPR 12X4 ELC STRL LF (DISPOSABLE) ×1
BNDG CMPR MED 5X4 ELC HKLP NS (GAUZE/BANDAGES/DRESSINGS) ×1
BNDG CONFORM 2 STRL LF (GAUZE/BANDAGES/DRESSINGS) ×3 IMPLANT
BNDG ESMARK 4X12 BLUE STRL LF (DISPOSABLE) ×3
BNDG GAUZE ELAST 4 BULKY (GAUZE/BANDAGES/DRESSINGS) ×3 IMPLANT
CHLORAPREP W/TINT 26ML (MISCELLANEOUS) ×3 IMPLANT
CLIP EZ FIXATION 10X10X10 (Staple) ×2 IMPLANT
CLOSURE WOUND 1/4 X3 (GAUZE/BANDAGES/DRESSINGS) ×3
CLOTH BEACON ORANGE TIMEOUT ST (SAFETY) ×3 IMPLANT
COVER LIGHT HANDLE STERIS (MISCELLANEOUS) ×6 IMPLANT
CUFF TOURNIQUET SINGLE 18IN (TOURNIQUET CUFF) ×2 IMPLANT
DECANTER SPIKE VIAL GLASS SM (MISCELLANEOUS) ×3 IMPLANT
DRAPE OEC MINIVIEW 54X84 (DRAPES) ×3 IMPLANT
DRILL CANNULATED 4MM (BIT) ×3
DRSG ADAPTIC 3X8 NADH LF (GAUZE/BANDAGES/DRESSINGS) ×3 IMPLANT
ELECT REM PT RETURN 9FT ADLT (ELECTROSURGICAL) ×3
ELECTRODE REM PT RTRN 9FT ADLT (ELECTROSURGICAL) ×1 IMPLANT
GAUZE SPONGE 4X4 12PLY STRL (GAUZE/BANDAGES/DRESSINGS) ×3 IMPLANT
GLOVE BIO SURGEON STRL SZ7.5 (GLOVE) ×3 IMPLANT
GLOVE BIOGEL PI IND STRL 7.0 (GLOVE) ×1 IMPLANT
GLOVE BIOGEL PI INDICATOR 7.0 (GLOVE) ×8
GLOVE ECLIPSE 6.5 STRL STRAW (GLOVE) ×2 IMPLANT
GUIDEWIRE ORTHO MINI ACTK .045 (WIRE) ×2 IMPLANT
Iliac Crest Wedge (ACF) 10-12 mm (Graft) ×2 IMPLANT
K-WIRE FIXATION CANN 1.1X120 (WIRE) ×3
KIT ROOM TURNOVER AP CYSTO (KITS) ×3 IMPLANT
KWIRE FIXATION CANN 1.1X120 (WIRE) IMPLANT
MANIFOLD NEPTUNE II (INSTRUMENTS) ×3 IMPLANT
NDL HYPO 27GX1-1/4 (NEEDLE) ×3 IMPLANT
NEEDLE HYPO 27GX1-1/4 (NEEDLE) ×6 IMPLANT
NS IRRIG 1000ML POUR BTL (IV SOLUTION) ×3 IMPLANT
PACK BASIC LIMB (CUSTOM PROCEDURE TRAY) ×3 IMPLANT
PAD ARMBOARD 7.5X6 YLW CONV (MISCELLANEOUS) ×3 IMPLANT
PLATE T OFFSET .8MM (Plate) ×2 IMPLANT
RASP SM TEAR CROSS CUT (RASP) ×2 IMPLANT
SCREW BONE LAG 2.3X10MM HEXA (Screw) IMPLANT
SCREW BONE LAG 2.3X8MM HEXA (Screw) IMPLANT
SCREW BONE TC 4.0MMX26MM (Screw) ×1 IMPLANT
SCREW BONE TC 4.0MMX28MM (Screw) ×1 IMPLANT
SCREW BONE TI6 LP 4.0X26 (Screw) IMPLANT
SCREW LAG 2.3X10MM (Screw) ×3 IMPLANT
SCREW LAG 2.3X8MM (Screw) ×3 IMPLANT
SCREW LOCK HEX MULTI 2.3X10 (Screw) ×2 IMPLANT
SCREW LOCK HEX MULTI 2.3X12 (Screw) ×16 IMPLANT
SCREW LOCK HEX MULTI 2.3X8 (Screw) ×4 IMPLANT
SET BASIN LINEN APH (SET/KITS/TRAYS/PACK) ×3 IMPLANT
SPONGE LAP 18X18 X RAY DECT (DISPOSABLE) ×3 IMPLANT
SPONGE LAP 4X18 X RAY DECT (DISPOSABLE) ×2 IMPLANT
STRIP CLOSURE SKIN 1/4X3 (GAUZE/BANDAGES/DRESSINGS) ×3 IMPLANT
SUT PROLENE 4 0 PS 2 18 (SUTURE) ×2 IMPLANT
SUT VIC AB 4-0 PS2 27 (SUTURE) ×5 IMPLANT
SUT VICRYL AB 3-0 FS1 BRD 27IN (SUTURE) ×3 IMPLANT
SYR BULB IRRIGATION 50ML (SYRINGE) ×2 IMPLANT
SYR CONTROL 10ML LL (SYRINGE) ×6 IMPLANT
TOWEL OR 17X26 4PK STRL BLUE (TOWEL DISPOSABLE) ×2 IMPLANT

## 2015-07-08 NOTE — Discharge Instructions (Signed)
These instructions will give you an idea of what to expect after surgery and how to manage issues that may arise before your first post op office visit.  Pain Management Pain is best managed by staying ahead of it. If pain gets out of control, it is difficult to get it back under control. Local anesthesia that lasts 6-8 hours is used to numb the foot and decrease pain.  For the best pain control, take the pain medication every 4 hours for the first 2 days post op. On the third day pain medication can be taken as needed.   Post Op Nausea Nausea is common after surgery, so it is managed proactively.  If prescribed, use the prescribed nausea medication regularly for the first 2 days post op.  Bandages Do not worry if there is blood on the bandage. What looks like a lot of blood on the bandage is actually a small amount. Blood on the dressing spreads out as it is absorbed by the gauze, the same way a drop of water spreads out on a paper towel.  If the bandages feel wet or dry, stiff and uncomfortable, call the office during office hours and we will schedule a time for you to have the bandage changed.  Unless you are specifically told otherwise, we will do the first bandage change in the office.  Keep your bandage dry. If the bandage becomes wet or soiled, notify the office and we will schedule a time to change the bandage.  Activity It is best to spend most of the first 2 days after surgery lying down with the foot elevated above the level of your heart. You may put weight on your heel only while wearing the CAM walker (black boot).   You may only get up to go to the restroom.  Driving Do not drive until you are able to respond in an emergency (i.e. slam on the brakes). This usually occurs after the bone has healed - 6 to 8 weeks.  Call the Office If you have a fever over 101F.  If you have increasing pain after the initial post op pain has settled down.  If you have increasing redness,  swelling, or drainage.  If you have any questions or concerns.

## 2015-07-08 NOTE — Transfer of Care (Signed)
Immediate Anesthesia Transfer of Care Note  Patient: Whitney Alvarez  Procedure(s) Performed: Procedure(s): REVISION BUNIONECTOMY LEFT FOOT (Left)  Patient Location: PACU  Anesthesia Type:MAC  Level of Consciousness: awake, alert  and oriented  Airway & Oxygen Therapy: Patient Spontanous Breathing and Patient connected to nasal cannula oxygen  Post-op Assessment: Report given to RN  Post vital signs: Reviewed and stable  Last Vitals:  Filed Vitals:   07/08/15 0820 07/08/15 0830  BP: 109/68 111/58  Pulse:    Temp:    Resp: 20 19    Last Pain: There were no vitals filed for this visit.    Patients Stated Pain Goal: 4 ([phone] [id number])  Complications: No apparent anesthesia complications

## 2015-07-08 NOTE — Progress Notes (Signed)
Dr Caprice Beaver at bedside to check pt. Soiled drsg removed. Sterile 4x4 gauze, kling and 4" ace wrap applied. Tolerated well. Ortho boot reapplied. Left exposed toes pink in color/warm to touch. Denies pain.

## 2015-07-08 NOTE — Addendum Note (Signed)
Addendum  created 07/08/15 1539 by Vista Deck, CRNA   Modules edited: Anesthesia Attestations

## 2015-07-08 NOTE — Op Note (Signed)
OPERATIVE NOTE  DATE OF PROCEDURE 07/08/2015  SURGEON Marcheta Grammes, [place]  OR STAFF Circulator: Glory Rosebush, RN Scrub Person: Lucie Leather, CST   PREOPERATIVE DIAGNOSIS 1.  Recurrent hallux valgus, left foot 2.  Extensus deformity of the second digit, left foot  POSTOPERATIVE DIAGNOSIS Same  PROCEDURE 1.  Closing base wedge osteotomy of the first metatarsal, left foot 2.  Akin osteotomy of the hallux, left foot 3.  Fusion of the second metatarsophalangeal joint with allograft, left foot  ANESTHESIA Monitor Anesthesia Care   HEMOSTASIS Pneumatic ankle tourniquet set at 250 mmHg  ESTIMATED BLOOD LOSS 25 cc  MATERIALS USED 1.  Acumed 4.0 millimeter partially-threaded screw 2.  Stryker easy clip staple 10 mm 3.  Tricortical allograft 4.  Acumed plate and corresponding screws  INJECTABLES 0.5% Marcaine plain and 1% Xylocaine plain  PATHOLOGY None  COMPLICATIONS None  INDICATIONS:  Recurrent bunion deformity of the left foot with contracture and shortening of the second toe of the left foot.  DESCRIPTION OF THE PROCEDURE:  The patient was brought to the operating room and placed on the operative table in the supine position.  A pneumatic ankle tourniquet was applied to the operative extremity.  Following sedation, the surgical site was anesthetized with 0.5% Marcaine plain.  The foot was then prepped, scrubbed, and draped in the usual sterile technique.  The foot was elevated, exsanguinated and the pneumatic ankle tourniquet inflated to 250 mmHg.    Attention was directed to the dorsal medial aspect of the left foot where 2 converging semielliptical incisions were made encompassing  the surgical scar from her previous surgery in its entirety.  A wedge of skin was removed and passed from the operative field.  Dissection was continued deep down to the level of bone.  A linear longitudinal periosteal incision was made along the base of the first metatarsal.  The  periosteal structures were reflected medially and laterally thus exposing the base of the first metatarsal at the operative site.  An oblique wedge osteotomy was performed the proximal first metatarsal.  The apex of the osteotomy was oriented medially and proximally.  The base was oriented distally and laterally.  The hinge was feathered using a power bone saw to allow for adequate closure of the osteotomy.  A wire from the Acumed screw set was placed across the osteotomy to serve as temporary fixation.  A 4.0 mm partially-threaded screw was inserted across the osteotomy.  The initial screw was found to be long on C-arm fluoroscopy and was exchanged for a shorter screw.  Good compression was noted.  A second screw was not placed based on the excellent compression of the first screw and limited space.  Attention was directed to the proximal phalanx of the hallux.  A linear longitudinal periosteal incision was made along the dorsal aspect of the proximal phalnx.  The periosteal structures were reflected medially and laterally.  A wedge-shaped osteotomy was performed in the proximal portion of the proximal phalanx.  The apex of the osteotomy was oriented laterally and the base medially.  A Stryker easy clip staple was placed across the osteotomy.  Position of the staple was confirmed with C-arm fluoroscopy.  Position was found to be acceptable.  The surgical wounds were irrigated with copious amounts of sterile irrigant.  Periosteal structures were reapproximated using 3-0 Vicryl.  The subcutaneous structures were reapproximated using 3-0 Vicryl.  Skin was reapproximated using 4-0 Prolene.  Skin closure was reinforced with 4-0 Prolene  and Steri-Strips.  The original block was augmented with 1% Xylocaine plain.  Attention was directed to the dorsal aspect of the second ray.  2 converging semielliptical incisions were made encompassing the surgical scar from her previous surgery in its entirety.  Wedge of skin was  removed and passed from the operative field.  Dissection was continued deep down to the level of the extensor digitorum longus tendon.  Significant contracture of the tendon was noted.  A Z-lengthening of the extensor digitorum longus tendon was performed.  The tendon was reflected proximally and distally allowing exposure to the second MPJ.  The field was dissected free of scar tissue and passed from the operative field.  The articular surface from the base of the proximal phalanx was removed using a power bone saw.  The distal aspect of the residual second metatarsal was debrided.  A tricortical bone graft was cut, contoured and prepared to fill the bone void from prior removal of the metatarsal head.  The ankle tourniquet was released and a prompt hyperemic response was noted to all digits of the operative foot.  The arthrodesis site and graft were fixated using a T-shaped plate with corresponding screws.  The second toe was placed in mild dorsiflexion.  I was unable to restore full length of the second ray based on the limitations of the graft size and concern for possible vascular compromise of the second toe.  Position of the graft, plate and screws were confirmed with C-arm fluoroscopy.  The surgical wound was irrigated with copious amounts of sterile irrigant.  The periosteal capsular structures were reapproximated using 3-0 Vicryl.  The extensor tendon was reapproximated using 3-0 Vicryl.  The subcutaneous structures reapproximated using 4-0 Vicryl.  The skin was reapproximated using 4-0 Vicryl and running subcuticular manner.  Skin closure was reinforced with 4-0 Prolene and Steri-Strips.  A sterile compressive dressing was applied to the left foot.   The patient tolerated the procedure well.  The patient was then transferred to PACU with vital signs stable and vascular status intact to all toes of the operative foot.

## 2015-07-08 NOTE — Progress Notes (Signed)
Drsg to left foot intact with moderate amt of bloody drainage. Dr Caprice Beaver notified. Will be in to check pt.

## 2015-07-08 NOTE — Anesthesia Preprocedure Evaluation (Signed)
Anesthesia Evaluation  Patient identified by MRN, date of birth, ID band Patient awake    Reviewed: Allergy & Precautions, H&P , NPO status , Patient's Chart, lab work & pertinent test results  History of Anesthesia Complications Negative for: history of anesthetic complications  Airway Mallampati: I  TM Distance: >3 FB     Dental  (+) Teeth Intact   Pulmonary neg pulmonary ROS,    breath sounds clear to auscultation       Cardiovascular negative cardio ROS   Rhythm:Regular Rate:Normal     Neuro/Psych negative psych ROS   GI/Hepatic negative GI ROS,   Endo/Other    Renal/GU      Musculoskeletal   Abdominal   Peds  Hematology   Anesthesia Other Findings   Reproductive/Obstetrics                             Anesthesia Physical Anesthesia Plan  ASA: I  Anesthesia Plan: MAC   Post-op Pain Management:    Induction: Intravenous  Airway Management Planned: Nasal Cannula  Additional Equipment:   Intra-op Plan:   Post-operative Plan:   Informed Consent: I have reviewed the patients History and Physical, chart, labs and discussed the procedure including the risks, benefits and alternatives for the proposed anesthesia with the patient or authorized representative who has indicated his/her understanding and acceptance.     Plan Discussed with:   Anesthesia Plan Comments:         Anesthesia Quick Evaluation

## 2015-07-08 NOTE — Progress Notes (Signed)
Left foot xrays completed.

## 2015-07-08 NOTE — Progress Notes (Signed)
Ortho boot applied to left foot.

## 2015-07-08 NOTE — H&P (Signed)
HISTORY AND PHYSICAL INTERVAL NOTE:  07/08/2015  8:18 AM  Whitney Alvarez  has presented today for surgery, with the diagnosis of hallux valgus and contracture of the second toe, left foot.  The various methods of treatment have been discussed with the patient.  No guarantees were given.  After consideration of risks, benefits and other options for treatment, the patient has consented to surgery.  I have reviewed the patients' chart and labs.    Patient Vitals for the past 24 hrs:  BP Temp Temp src Pulse Resp SpO2 Height Weight  07/08/15 0730 121/68 mmHg - - - (!) 21 98 % - -  07/08/15 0725 (!) 99/57 mmHg - - - (!) 21 97 % - -  07/08/15 0724 (!) 99/57 mmHg 98.1 F (36.7 C) Oral 66 19 97 % - -  07/08/15 0711 - - - - - - 5' 7.5" (1.715 m) 149 lb (67.586 kg)    A history and physical examination was performed in my office.  The patient was reexamined.  There have been no changes to this history and physical examination.  Marcheta Grammes, DPM

## 2015-07-08 NOTE — Progress Notes (Signed)
Awake. Denies pain. Sprite given to drink. Tolerated well.

## 2015-07-08 NOTE — Brief Op Note (Signed)
BRIEF OPERATIVE NOTE  DATE OF PROCEDURE 07/08/2015  SURGEON Marcheta Grammes, [place]  OR STAFF Circulator: Glory Rosebush, RN Scrub Person: Lucie Leather, CST   PREOPERATIVE DIAGNOSIS 1.  Recurrent hallux valgus, left foot 2.  Extensus deformity of the second digit, left foot  POSTOPERATIVE DIAGNOSIS Same  PROCEDURE 1.  Closing base wedge osteotomy of the first metatarsal, left foot 2.  Akin osteotomy of the hallux, left foot 3.  Fusion of the second metatarsophalangeal joint with allograft, left foot  ANESTHESIA Monitor Anesthesia Care   HEMOSTASIS Pneumatic ankle tourniquet set at 250 mmHg  ESTIMATED BLOOD LOSS 25 cc  MATERIALS USED 1.  Acumed 4.0 millimeter partially-threaded screw 2.  Stryker easy clip staple 10 mm 3.  Tricortical allograft 4.  Acumed plate and corresponding screws  INJECTABLES 0.5% Marcaine plain and 1% Xylocaine plain  PATHOLOGY None  COMPLICATIONS None

## 2015-07-08 NOTE — Anesthesia Postprocedure Evaluation (Signed)
Anesthesia Post Note  Patient: SHEMICKA ANGERMAN  Procedure(s) Performed: Procedure(s) (LRB): CLOSING BASE WEDGE 1ST METATARSAL LEFT FOOT (Left) AIKEN OSTEOTOMY LEFT GREAT TOE (Left) ARTHRODESIS METATARSOPHALANGEAL JOINT WITH AN ALLOGRAFT LEFT FOOT (Left)  Patient location during evaluation: PACU Anesthesia Type: MAC Level of consciousness: awake and alert and oriented Pain management: pain level controlled Vital Signs Assessment: post-procedure vital signs reviewed and stable Respiratory status: spontaneous breathing Cardiovascular status: blood pressure returned to baseline Postop Assessment: no signs of nausea or vomiting Anesthetic complications: no    Last Vitals:  Filed Vitals:   07/08/15 1300 07/08/15 1315  BP: 105/64 101/67  Pulse: 68 65  Temp:    Resp: 22 17    Last Pain: There were no vitals filed for this visit.               Finn Amos

## 2015-07-10 ENCOUNTER — Encounter (HOSPITAL_COMMUNITY): Payer: Self-pay | Admitting: Podiatry

## 2015-07-23 ENCOUNTER — Other Ambulatory Visit (INDEPENDENT_AMBULATORY_CARE_PROVIDER_SITE_OTHER): Payer: Self-pay | Admitting: *Deleted

## 2015-07-23 DIAGNOSIS — Z1211 Encounter for screening for malignant neoplasm of colon: Secondary | ICD-10-CM

## 2015-07-30 ENCOUNTER — Encounter: Payer: Self-pay | Admitting: Adult Health

## 2015-07-30 ENCOUNTER — Ambulatory Visit (INDEPENDENT_AMBULATORY_CARE_PROVIDER_SITE_OTHER): Payer: BC Managed Care – PPO | Admitting: Adult Health

## 2015-07-30 ENCOUNTER — Other Ambulatory Visit (HOSPITAL_COMMUNITY)
Admission: RE | Admit: 2015-07-30 | Discharge: 2015-07-30 | Disposition: A | Discharge status: Home / Self Care | Payer: BC Managed Care – PPO | Source: Ambulatory Visit | Attending: Adult Health | Admitting: Adult Health

## 2015-07-30 VITALS — BP 126/86 | HR 74 | Ht 67.0 in | Wt 152.5 lb

## 2015-07-30 DIAGNOSIS — Z1211 Encounter for screening for malignant neoplasm of colon: Secondary | ICD-10-CM | POA: Diagnosis not present

## 2015-07-30 DIAGNOSIS — Z01419 Encounter for gynecological examination (general) (routine) without abnormal findings: Secondary | ICD-10-CM | POA: Diagnosis present

## 2015-07-30 DIAGNOSIS — Z1151 Encounter for screening for human papillomavirus (HPV): Secondary | ICD-10-CM | POA: Insufficient documentation

## 2015-07-30 LAB — HEMOCCULT GUIAC POC 1CARD (OFFICE): Fecal Occult Blood, POC: NEGATIVE

## 2015-07-30 NOTE — Patient Instructions (Signed)
Physical in 1 year, pap in 3 if normal Mammogram yearly Colonoscopy in October

## 2015-07-30 NOTE — Progress Notes (Signed)
Patient ID: JAEDA YELINEK, female   DOB: [birth year], 60 y.o.   MRN: [id number] History of Present Illness: CB is a 60 year old white female, divorced in for a well woman gyn exam and pap.She recently had surgery left foot and is in a boot. PCP is Z. Hall.   Current Medications, Allergies, Past Medical History, Past Surgical History, Family History and Social History were reviewed in [employer] record.     Review of Systems: Patient denies any headaches, hearing loss, fatigue, blurred vision, shortness of breath, chest pain, abdominal pain, problems with bowel movements, urination, or intercourse. No joint pain or mood swings.She is off the HRT patch, ha hot flash sometimes.    Physical Exam:BP 126/86 mmHg  Pulse 74  Ht 5\' 7"  (1.702 m)  Wt 152 lb 8 oz (69.174 kg)  BMI 23.88 kg/m2 General:  Well developed, well nourished, no acute distress Skin:  Warm and dry,tan Neck:  Midline trachea, normal thyroid, good ROM, no lymphadenopathy,no carotid bruits heard Lungs; Clear to auscultation bilaterally Breast:  No dominant palpable mass, retraction, or nipple discharge Cardiovascular: Regular rate and rhythm Abdomen:  Soft, non tender, no hepatosplenomegaly Pelvic:  External genitalia is normal in appearance, no lesions.  The vagina has decreased color, moisture and rugae. Urethra has no lesions or masses. The cervix is smooth and stenotic, pap with HPV performed.  Uterus is felt to be normal size, shape, and contour.  No adnexal masses or tenderness noted.Bladder is non tender, no masses felt. Rectal: Good sphincter tone, no polyps, or hemorrhoids felt.  Hemoccult negative. Extremities/musculoskeletal:  No swelling or varicosities noted, no clubbing or cyanosis Psych:  No mood changes, alert and cooperative,seems happy   Impression:  Well woman gyn exam and pap    Plan: Check CBC,CMP,TSH and lipids,A1c and vitamin D in near future, orders given Mammogram  yearly Colonoscopy in October,2017 Physical in 1 year, pap in 3 if normal

## 2015-07-31 LAB — CYTOLOGY - PAP

## 2015-08-12 ENCOUNTER — Encounter (INDEPENDENT_AMBULATORY_CARE_PROVIDER_SITE_OTHER): Payer: Self-pay | Admitting: *Deleted

## 2015-08-21 LAB — COMPREHENSIVE METABOLIC PANEL
A/G RATIO: 1.8 (ref 1.2–2.2)
ALT: 14 IU/L (ref 0–32)
AST: 13 IU/L (ref 0–40)
Albumin: 4.2 g/dL (ref 3.6–4.8)
Alkaline Phosphatase: 95 IU/L (ref 39–117)
BUN/Creatinine Ratio: 21 (ref 12–28)
BUN: 14 mg/dL (ref 8–27)
Bilirubin Total: 0.5 mg/dL (ref 0.0–1.2)
CALCIUM: 9.8 mg/dL (ref 8.7–10.3)
CO2: 26 mmol/L (ref 18–29)
Chloride: 103 mmol/L (ref 96–106)
Creatinine, Ser: 0.68 mg/dL (ref 0.57–1.00)
GFR calc Af Amer: 110 mL/min/{1.73_m2} (ref >59)
GFR, EST NON AFRICAN AMERICAN: 95 mL/min/{1.73_m2} (ref >59)
Globulin, Total: 2.4 g/dL (ref 1.5–4.5)
Glucose: 80 mg/dL (ref 65–99)
POTASSIUM: 4.3 mmol/L (ref 3.5–5.2)
Sodium: 144 mmol/L (ref 134–144)
Total Protein: 6.6 g/dL (ref 6.0–8.5)

## 2015-08-21 LAB — LIPID PANEL
CHOL/HDL RATIO: 2.2 ratio (ref 0.0–4.4)
Cholesterol, Total: 181 mg/dL (ref 100–199)
HDL: 83 mg/dL (ref >39)
LDL CALC: 86 mg/dL (ref 0–99)
Triglycerides: 62 mg/dL (ref 0–149)
VLDL CHOLESTEROL CAL: 12 mg/dL (ref 5–40)

## 2015-08-21 LAB — CBC
Hematocrit: 42 % (ref 34.0–46.6)
Hemoglobin: 14.3 g/dL (ref 11.1–15.9)
MCH: 32.6 pg (ref 26.6–33.0)
MCHC: 34 g/dL (ref 31.5–35.7)
MCV: 96 fL (ref 79–97)
PLATELETS: 274 10*3/uL (ref 150–379)
RBC: 4.39 x10E6/uL (ref 3.77–5.28)
RDW: 13.9 % (ref 12.3–15.4)
WBC: 4.6 10*3/uL (ref 3.4–10.8)

## 2015-08-21 LAB — TSH: TSH: 2.12 u[IU]/mL (ref 0.450–4.500)

## 2015-08-21 LAB — HEMOGLOBIN A1C
Est. average glucose Bld gHb Est-mCnc: 97 mg/dL
Hgb A1c MFr Bld: 5 % (ref 4.8–5.6)

## 2015-08-21 LAB — VITAMIN D 25 HYDROXY (VIT D DEFICIENCY, FRACTURES): VIT D 25 HYDROXY: 27.8 ng/mL — AB (ref 30.0–100.0)

## 2015-08-24 ENCOUNTER — Telehealth: Payer: Self-pay | Admitting: Adult Health

## 2015-08-24 NOTE — Telephone Encounter (Signed)
Pt aware labs are excellent excpet vitamin D a little low, take vitamin D 3 2000 IU every day

## 2015-10-26 ENCOUNTER — Telehealth (INDEPENDENT_AMBULATORY_CARE_PROVIDER_SITE_OTHER): Payer: Self-pay | Admitting: *Deleted

## 2015-10-26 ENCOUNTER — Encounter (INDEPENDENT_AMBULATORY_CARE_PROVIDER_SITE_OTHER): Payer: Self-pay | Admitting: *Deleted

## 2015-10-26 NOTE — Telephone Encounter (Signed)
Patient needs trilyte 

## 2015-10-26 NOTE — Telephone Encounter (Signed)
Referring MD/PCP: hall   Procedure: tcs  Reason/Indication:  screening  Has patient had this procedure before?  Yes, 10 yrs ago  If so, when, by whom and where?    Is there a family history of colon cancer?  no  Who?  What age when diagnosed?    Is patient diabetic?   no      Does patient have prosthetic heart valve or mechanical valve?  no  Do you have a pacemaker?  no  Has patient ever had endocarditis? no  Has patient had joint replacement within last 12 months?  no  Does patient tend to be constipated or take laxatives? no  Does patient have a history of alcohol/drug use?  no  Is patient on Coumadin, Plavix and/or Aspirin? no  Medications: none  Allergies: nkda  Medication Adjustment:   Procedure date & time: 11/25/15

## 2015-10-27 MED ORDER — PEG 3350-KCL-NA BICARB-NACL 420 G PO SOLR: 4000.0000 mL | Freq: Once | ORAL | 0 refills | Status: AC

## 2015-10-27 NOTE — Telephone Encounter (Signed)
agree

## 2015-12-31 ENCOUNTER — Encounter (INDEPENDENT_AMBULATORY_CARE_PROVIDER_SITE_OTHER): Payer: Self-pay | Admitting: *Deleted

## 2016-01-12 ENCOUNTER — Telehealth (INDEPENDENT_AMBULATORY_CARE_PROVIDER_SITE_OTHER): Payer: Self-pay | Admitting: *Deleted

## 2016-01-12 NOTE — Telephone Encounter (Signed)
Referring MD/PCP: hall   Procedure: tcs  Reason/Indication:  screening  Has patient had this procedure before?  Yes, 10 yrs ago  If so, when, by whom and where?    Is there a family history of colon cancer?  no  Who?  What age when diagnosed?    Is patient diabetic?   no      Does patient have prosthetic heart valve or mechanical valve?  no  Do you have a pacemaker?  no  Has patient ever had endocarditis? no  Has patient had joint replacement within last 12 months?  no  Does patient tend to be constipated or take laxatives? no  Does patient have a history of alcohol/drug use?  no  Is patient on Coumadin, Plavix and/or Aspirin? no  Medications: none  Allergies: nkda  Medication Adjustment:   Procedure date & time: 02/10/16 at 1030

## 2016-01-12 NOTE — Telephone Encounter (Signed)
agree

## 2016-02-10 ENCOUNTER — Encounter (HOSPITAL_COMMUNITY)
Admission: RE | Disposition: A | Discharge status: Home / Self Care | Payer: Self-pay | Source: Ambulatory Visit | Attending: Internal Medicine

## 2016-02-10 ENCOUNTER — Encounter (HOSPITAL_COMMUNITY): Payer: Self-pay | Admitting: *Deleted

## 2016-02-10 ENCOUNTER — Ambulatory Visit (HOSPITAL_COMMUNITY)
Admission: RE | Admit: 2016-02-10 | Discharge: 2016-02-10 | Disposition: A | Discharge status: Home / Self Care | Payer: BC Managed Care – PPO | Source: Ambulatory Visit | Attending: Internal Medicine | Admitting: Internal Medicine

## 2016-02-10 DIAGNOSIS — Z1211 Encounter for screening for malignant neoplasm of colon: Secondary | ICD-10-CM

## 2016-02-10 DIAGNOSIS — K644 Residual hemorrhoidal skin tags: Secondary | ICD-10-CM | POA: Insufficient documentation

## 2016-02-10 HISTORY — PX: COLONOSCOPY: SHX5424

## 2016-02-10 SURGERY — COLONOSCOPY
Anesthesia: Moderate Sedation

## 2016-02-10 MED ORDER — MIDAZOLAM HCL 5 MG/5ML IJ SOLN
INTRAMUSCULAR | Status: AC
  Filled 2016-02-10: qty 10

## 2016-02-10 MED ORDER — SODIUM CHLORIDE 0.9 % IV SOLN
INTRAVENOUS | Status: DC
  Administered 2016-02-10: 09:00:00 via INTRAVENOUS

## 2016-02-10 MED ORDER — MEPERIDINE HCL 50 MG/ML IJ SOLN
INTRAMUSCULAR | Status: DC | PRN
  Administered 2016-02-10 (×2): 25 mg via INTRAVENOUS

## 2016-02-10 MED ORDER — SIMETHICONE 40 MG/0.6ML PO SUSP
ORAL | Status: DC | PRN
  Administered 2016-02-10: 100 mL

## 2016-02-10 MED ORDER — MIDAZOLAM HCL 5 MG/5ML IJ SOLN
INTRAMUSCULAR | Status: DC | PRN
  Administered 2016-02-10: 2 mg via INTRAVENOUS
  Administered 2016-02-10: 1 mg via INTRAVENOUS
  Administered 2016-02-10: 2 mg via INTRAVENOUS

## 2016-02-10 MED ORDER — MEPERIDINE HCL 50 MG/ML IJ SOLN
INTRAMUSCULAR | Status: DC
Start: 2016-02-10 — End: 2016-02-10
  Filled 2016-02-10: qty 1

## 2016-02-10 NOTE — Op Note (Signed)
[facility] Patient Name: Whitney Alvarez Procedure Date: 02/10/2016 9:25 AM MRN: [id number] Date of Birth: [birth year] Attending MD: Hildred Laser , MD CSN: [id number] Age: 61 Admit Type: Outpatient Procedure:                Colonoscopy Indications:              Screening for colorectal malignant neoplasm Providers:                Hildred Laser, MD, Rosina Lowenstein, RN, Randa Spike, Technician Referring MD:             Delphina Cahill, MD Medicines:                Meperidine 50 mg IV, Midazolam 5 mg IV Complications:            No immediate complications. Estimated Blood Loss:     Estimated blood loss: none. Procedure:                Pre-Anesthesia Assessment:                           - Prior to the procedure, a History and Physical                            was performed, and patient medications and                            allergies were reviewed. The patient's tolerance of                            previous anesthesia was also reviewed. The risks                            and benefits of the procedure and the sedation                            options and risks were discussed with the patient.                            All questions were answered, and informed consent                            was obtained. Prior Anticoagulants: The patient has                            taken no previous anticoagulant or antiplatelet                            agents. ASA Grade Assessment: I - A normal, healthy                            patient. After reviewing the risks and benefits,  the patient was deemed in satisfactory condition to                            undergo the procedure.                           After obtaining informed consent, the colonoscope                            was passed under direct vision. Throughout the                            procedure, the patient's blood pressure, pulse, and   oxygen saturations were monitored continuously. The                            [id number]) scope was introduced through                            the and advanced to the the cecum, identified by                            appendiceal orifice and ileocecal valve. The                            colonoscopy was performed without difficulty. The                            patient tolerated the procedure well. The quality                            of the bowel preparation was good. The ileocecal                            valve, appendiceal orifice, and rectum were                            photographed. Scope In: 10:01:33 AM Scope Out: 10:24:14 AM Scope Withdrawal Time: 0 hours 9 minutes 31 seconds  Total Procedure Duration: 0 hours 22 minutes 41 seconds  Findings:      The perianal and digital rectal examinations were normal.      The colon (entire examined portion) appeared normal.      External hemorrhoids were found during retroflexion. The hemorrhoids       were small. Impression:               - The entire examined colon is normal.                           - External hemorrhoids.                           - No specimens collected. Moderate Sedation:      Moderate (conscious) sedation was administered by the endoscopy nurse       and supervised by the endoscopist. The following parameters were  monitored: oxygen saturation, heart rate, blood pressure, CO2       capnography and response to care. Total physician intraservice time was       29 minutes. Recommendation:           - Patient has a contact number available for                            emergencies. The signs and symptoms of potential                            delayed complications were discussed with the                            patient. Return to normal activities tomorrow.                            Written discharge instructions were provided to the                            patient.                            - Resume previous diet today.                           - Continue present medications.                           - No repeat colonoscopy.                           - Repeat colonoscopy in 10 years for screening                            purposes. Procedure Code(s):        --- Professional ---                           [phone], Colonoscopy, flexible; diagnostic, including                            collection of specimen(s) by brushing or washing,                            when performed (separate procedure)                           99152, Moderate sedation services provided by the                            same physician or other qualified health care                            professional performing the diagnostic or  therapeutic service that the sedation supports,                            requiring the presence of an independent trained                            observer to assist in the monitoring of the                            patient's level of consciousness and physiological                            status; initial 15 minutes of intraservice time,                            patient age 67 years or older                           [phone], Moderate sedation services; each additional                            15 minutes intraservice time Diagnosis Code(s):        --- Professional ---                           Z12.11, Encounter for screening for malignant                            neoplasm of colon                           K64.4, Residual hemorrhoidal skin tags CPT copyright 2016 [employer]. All rights reserved. The codes documented in this report are preliminary and upon coder review may  be revised to meet current compliance requirements. Hildred Laser, MD Hildred Laser, MD 02/10/2016 10:30:53 AM This report has been signed electronically. Number of Addenda: 0

## 2016-02-10 NOTE — H&P (Signed)
Whitney Alvarez is an 61 y.o. female.   Chief Complaint: Patient is here for screening colonoscopy. HPI: Patient is 61 year old Caucasian female who is here for screening colonoscopy. She denies abdominal pain change in bowel habits or frank rectal bleeding. She has hemorrhoids and occasional hematochezia. Last colonoscopy was in June 2007 and was normal other than small external hemorrhoids. Family History is negative for CRC.  Past Medical History:  Diagnosis Date  . Hormone replacement therapy (HRT) 03/27/2013    Past Surgical History:  Procedure Laterality Date  . Barbie Banner OSTEOTOMY Left 07/08/2015   Procedure: Barbie Banner OSTEOTOMY LEFT GREAT TOE;  Surgeon: Caprice Beaver, DPM;  Location: [facility];  Service: Podiatry;  Laterality: Left;  . BUNIONECTOMY Left 12/13/2012   Procedure: Altamese [place];  Surgeon: Marcheta Grammes, DPM;  Location: [facility];  Service: Orthopedics;  Laterality: Left;  . BUNIONECTOMY Left 07/08/2015   Procedure: CLOSING BASE WEDGE 1ST METATARSAL LEFT FOOT;  Surgeon: Caprice Beaver, DPM;  Location: [facility];  Service: Podiatry;  Laterality: Left;  . CESAREAN SECTION     x2  . COLONOSCOPY    . FOOT ARTHRODESIS Left 07/08/2015   Procedure: ARTHRODESIS METATARSOPHALANGEAL JOINT WITH AN ALLOGRAFT LEFT FOOT;  Surgeon: Caprice Beaver, DPM;  Location: [facility];  Service: Podiatry;  Laterality: Left;  . METATARSAL HEAD EXCISION Left 12/13/2012   Procedure: 2ND METATARSAL HEAD RESECTION LEFT FOOT;  Surgeon: Marcheta Grammes, DPM;  Location: [facility];  Service: Orthopedics;  Laterality: Left;  . PROXIMAL INTERPHALANGEAL FUSION (PIP) Left 12/13/2012   Procedure: PROXIMAL INTERPHALANGEAL JOINT FUSION (PIP) 2ND TOE LEFT FOOT;  Surgeon: Marcheta Grammes, DPM;  Location: [facility];  Service: Orthopedics;  Laterality: Left;    Family History  Problem Relation Age of Onset  . Cancer Mother     bladder  . Cancer Father     lymphoma, bone marrow   . Hypertension  Brother   . COPD Maternal Aunt   . Cancer Paternal Uncle   . Cancer Maternal Grandfather   . Colon cancer Neg Hx    Social History:  reports that she has never smoked. She has never used smokeless tobacco. She reports that she drinks alcohol. She reports that she does not use drugs.  Allergies: No Known Allergies  Medications Prior to Admission  Medication Sig Dispense Refill  . cholecalciferol (VITAMIN D) 1000 units tablet Take 1,000 Units by mouth daily.      No results found for this or any previous visit (from the past 48 hour(s)). No results found.  ROS  Blood pressure 112/75, pulse 71, temperature 98.2 F (36.8 C), temperature source Oral, resp. rate 12, height 5\' 7"  (1.702 m), weight 150 lb (68 kg), SpO2 99 %. Physical Exam  Constitutional: She appears well-developed and well-nourished.  HENT:  Mouth/Throat: Oropharynx is clear and moist.  Eyes: Conjunctivae are normal. No scleral icterus.  Neck: No thyromegaly present.  Cardiovascular: Normal rate, regular rhythm and normal heart sounds.   No murmur heard. Respiratory: Effort normal and breath sounds normal.  GI: Soft. She exhibits no distension and no mass. There is no tenderness.  Musculoskeletal: She exhibits no edema.  Lymphadenopathy:    She has no cervical adenopathy.  Neurological: She is alert.  Skin: Skin is warm and dry.     Assessment/Plan Average risk screening colonoscopy.  Hildred Laser, MD 02/10/2016, 9:51 AM

## 2016-02-10 NOTE — Discharge Instructions (Signed)
Resume usual medications and diet. °No driving for 24 hours. °Next screening exam in 10 years. ° ° °Colonoscopy, Adult, Care After °This sheet gives you information about how to care for yourself after your procedure. Your health care provider may also give you more specific instructions. If you have problems or questions, contact your health care provider. °What can I expect after the procedure? °After the procedure, it is common to have: °· A small amount of blood in your stool for 24 hours after the procedure. °· Some gas. °· Mild abdominal cramping or bloating. °Follow these instructions at home: °General instructions °· For the first 24 hours after the procedure: °¨ Do not drive or use machinery. °¨ Do not sign important documents. °¨ Do not drink alcohol. °¨ Do your regular daily activities at a slower pace than normal. °¨ Eat soft, easy-to-digest foods. °¨ Rest often. °· Take over-the-counter or prescription medicines only as told by your health care provider. °· It is up to you to get the results of your procedure. Ask your health care provider, or the department performing the procedure, when your results will be ready. °Relieving cramping and bloating °· Try walking around when you have cramps or feel bloated. °· Apply heat to your abdomen as told by your health care provider. Use a heat source that your health care provider recommends, such as a moist heat pack or a heating pad. °¨ Place a towel between your skin and the heat source. °¨ Leave the heat on for 20-30 minutes. °¨ Remove the heat if your skin turns bright red. This is especially important if you are unable to feel pain, heat, or cold. You may have a greater risk of getting burned. °Eating and drinking °· Drink enough fluid to keep your urine clear or pale yellow. °· Resume your normal diet as instructed by your health care provider. Avoid heavy or fried foods that are hard to digest. °· Avoid drinking alcohol for as long as instructed by your  health care provider. °Contact a health care provider if: °· You have blood in your stool 2-3 days after the procedure. °Get help right away if: °· You have more than a small spotting of blood in your stool. °· You pass large blood clots in your stool. °· Your abdomen is swollen. °· You have nausea or vomiting. °· You have a fever. °· You have increasing abdominal pain that is not relieved with medicine. °This information is not intended to replace advice given to you by your health care provider. Make sure you discuss any questions you have with your health care provider. °Document Released: 08/25/2003 Document Revised: 10/05/2015 Document Reviewed: 03/24/2015 °Elsevier Interactive Patient Education © 2017 Else[employer]. ° °

## 2016-02-11 ENCOUNTER — Encounter (HOSPITAL_COMMUNITY): Payer: Self-pay | Admitting: Internal Medicine

## 2016-02-23 ENCOUNTER — Other Ambulatory Visit (HOSPITAL_COMMUNITY): Payer: Self-pay | Admitting: Internal Medicine

## 2016-02-23 DIAGNOSIS — Z1231 Encounter for screening mammogram for malignant neoplasm of breast: Secondary | ICD-10-CM

## 2016-02-29 ENCOUNTER — Ambulatory Visit (HOSPITAL_COMMUNITY)
Admission: RE | Admit: 2016-02-29 | Discharge: 2016-02-29 | Disposition: A | Discharge status: Home / Self Care | Payer: BC Managed Care – PPO | Source: Ambulatory Visit | Attending: Internal Medicine | Admitting: Internal Medicine

## 2016-02-29 DIAGNOSIS — Z1231 Encounter for screening mammogram for malignant neoplasm of breast: Secondary | ICD-10-CM | POA: Insufficient documentation

## 2016-03-31 ENCOUNTER — Telehealth: Payer: Self-pay | Admitting: *Deleted

## 2016-03-31 NOTE — Telephone Encounter (Signed)
Pt was lifting weights heard a pop and now has swollen area near sternum  And tenderness, discussed with Dr Elonda Husky could be dislocated rib, could refer to PT, she will be out of town all weekend, and will call me Monday if still bothering her.

## 2016-08-16 IMAGING — CR DG FOOT COMPLETE 3+V*L*
3 series · 3 of 3 positions shown · non-contrast
Comparison: Radiographs dated 07/03/2015 and 12/21/2012

CLINICAL DATA: Hallux valgus.  Contracture of the second toe.

EXAM:
LEFT FOOT - COMPLETE 3+ VIEW

[ap]
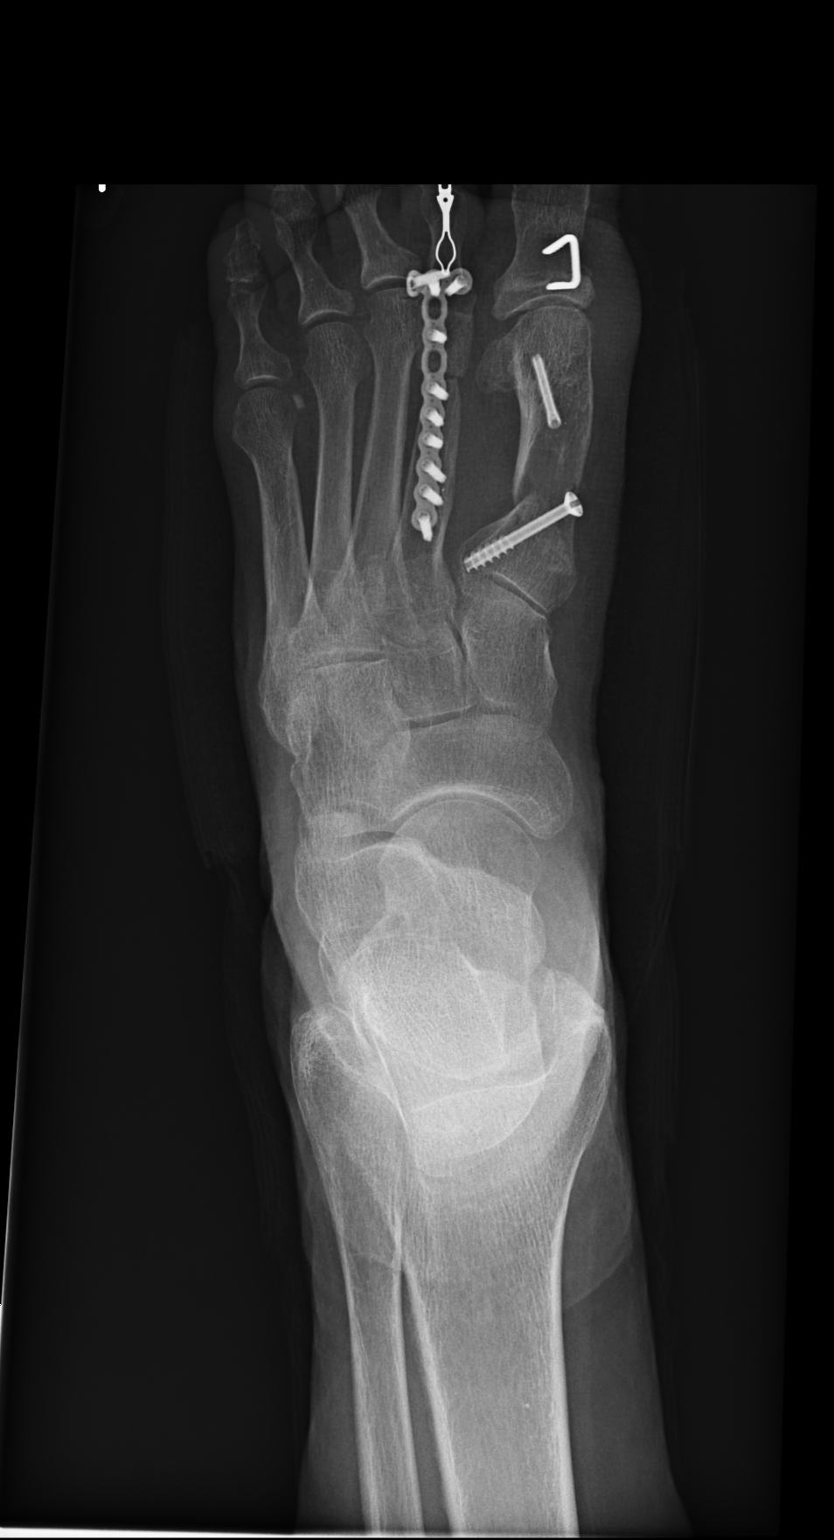

[oblique]
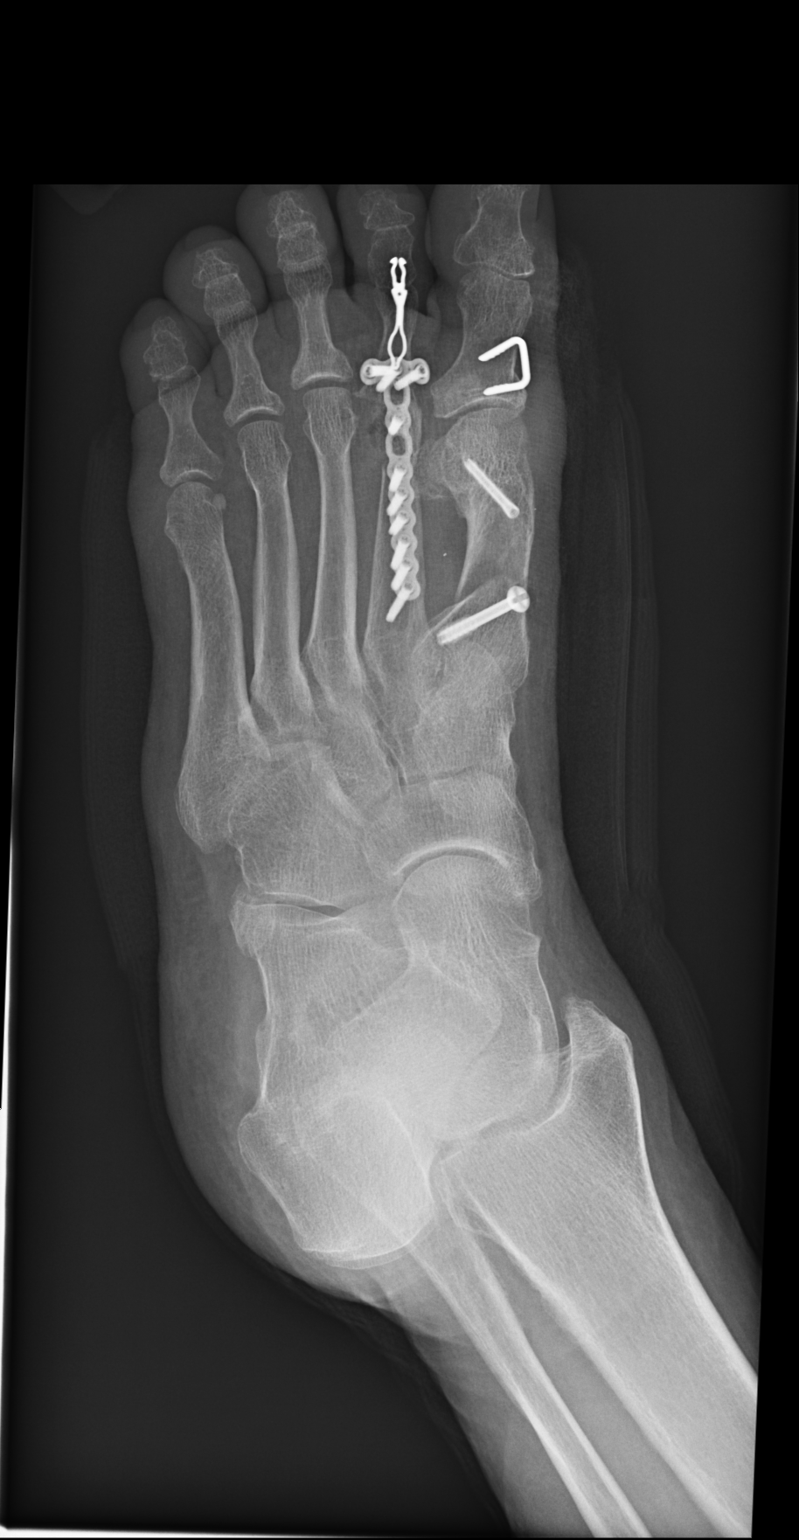

[lat]
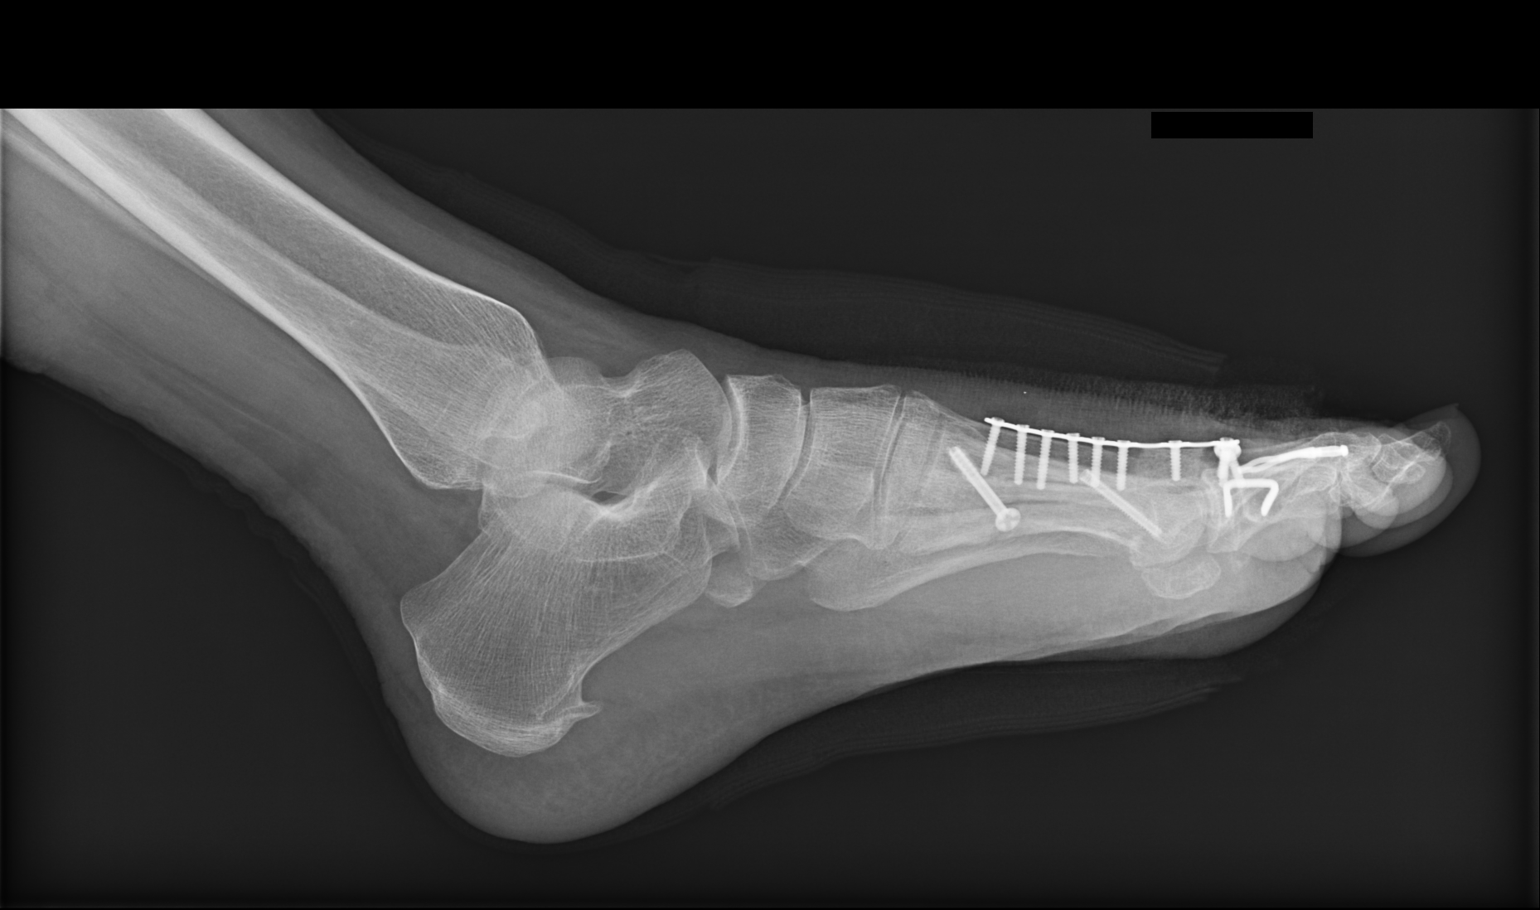

[3 of 3 positions shown; findings below may reference images not displayed]

FINDINGS: The patient has undergone fusion at the second metatarsal phalangeal
joint. The patient has also undergone correction of hallux valgus
with a staple in the proximal phalanx of the great toe with proximal
first metatarsal osteotomy with fixation screw.

There is unchanged hardware in the head of the first metatarsal and
bridging the IP joint of the second toe.

Small plantar calcaneal spur. Minimal degenerative changes at the
ankle.
IMPRESSION: New postsurgical changes as described.

## 2016-08-30 ENCOUNTER — Other Ambulatory Visit (HOSPITAL_COMMUNITY): Payer: Self-pay | Admitting: Internal Medicine

## 2016-08-30 DIAGNOSIS — R222 Localized swelling, mass and lump, trunk: Secondary | ICD-10-CM

## 2016-09-05 ENCOUNTER — Telehealth: Payer: Self-pay | Admitting: Internal Medicine

## 2016-09-06 ENCOUNTER — Encounter (HOSPITAL_COMMUNITY): Payer: Self-pay

## 2016-09-06 ENCOUNTER — Ambulatory Visit (HOSPITAL_COMMUNITY): Payer: BC Managed Care – PPO

## 2016-10-13 ENCOUNTER — Encounter: Payer: Self-pay | Admitting: Adult Health

## 2016-10-13 ENCOUNTER — Ambulatory Visit (INDEPENDENT_AMBULATORY_CARE_PROVIDER_SITE_OTHER): Payer: BC Managed Care – PPO | Admitting: Adult Health

## 2016-10-13 VITALS — BP 80/60 | HR 82 | Ht 67.0 in | Wt 158.0 lb

## 2016-10-13 DIAGNOSIS — Z1212 Encounter for screening for malignant neoplasm of rectum: Secondary | ICD-10-CM

## 2016-10-13 DIAGNOSIS — Z01419 Encounter for gynecological examination (general) (routine) without abnormal findings: Secondary | ICD-10-CM | POA: Diagnosis not present

## 2016-10-13 DIAGNOSIS — Z1211 Encounter for screening for malignant neoplasm of colon: Secondary | ICD-10-CM | POA: Diagnosis not present

## 2016-10-13 DIAGNOSIS — Z1321 Encounter for screening for nutritional disorder: Secondary | ICD-10-CM

## 2016-10-13 DIAGNOSIS — Z13220 Encounter for screening for lipoid disorders: Secondary | ICD-10-CM

## 2016-10-13 DIAGNOSIS — R635 Abnormal weight gain: Secondary | ICD-10-CM

## 2016-10-13 LAB — HEMOCCULT GUIAC POC 1CARD (OFFICE): Fecal Occult Blood, POC: NEGATIVE

## 2016-10-13 NOTE — Patient Instructions (Signed)
Physical in 1 year Pap in 2020 Mammogram yearly 

## 2016-10-13 NOTE — Progress Notes (Signed)
Patient ID: Whitney Alvarez, female   DOB: [birth year], 61 y.o.   MRN: [id number] History of Present Illness: Whitney "Whitney Alvarez" is a 61 year old white female, divorced, G2P2, PM in for well woman gyn exam, she had normal pap with negative HPV 07/30/15. Whitney Alvarez is working out with trainer 3 x weekly and tries to walk most days, still works PT in school system. She is in stable relationship.  PCP is Dr Whitney Alvarez.    Current Medications, Allergies, Past Medical History, Past Surgical History, Family History and Social History were reviewed in [employer] record.     Review of Systems:  Patient denies any headaches, hearing loss, fatigue, blurred vision, shortness of breath, chest pain, abdominal pain, problems with bowel movements(has 1 every 3 days), urination, or intercourse. No joint pain or mood swings. +weight gain  Physical Exam:BP (!) 80/60 (BP Location: Left Arm, Patient Position: Sitting, Cuff Size: Small)   Pulse 82   Ht 5\' 7"  (1.702 m)   Wt 158 lb (71.7 kg)   BMI 24.75 kg/m  General:  Well developed, well nourished, no acute distress Skin:  Warm and dry Neck:  Midline trachea, normal thyroid, good ROM, no lymphadenopathy, no carotid bruits heard, has fat tissue above clavicle on both side, had negative CT Lungs; Clear to auscultation bilaterally Breast:  No dominant palpable mass, retraction, or nipple discharge Cardiovascular: Regular rate and rhythm Abdomen:  Soft, non tender, no hepatosplenomegaly Pelvic:  External genitalia is normal in appearance, no lesions.  The vagina is normal in appearance. Urethra has no lesions or masses. The cervix is smooth.  Uterus is felt to be normal size, shape, and contour.  No adnexal masses or tenderness noted.Bladder is non tender, no masses felt. Rectal: Good sphincter tone, no polyps, or hemorrhoids felt.  Hemoccult negative. Extremities/musculoskeletal:  No swelling or varicosities noted, no clubbing or cyanosis Psych:  No mood  changes, alert and cooperative,seems happy PHQ 2 score 0.  Impression: 1. Encounter for well woman exam with routine gynecological exam   2. Screening for colorectal cancer   3. Screening cholesterol level   4. Weight gain   5. Encounter for vitamin deficiency screening       Plan: Check CBC,CMP,TSH and lipids,vitamin D Physical in 1 year Pap in 2020 Mammogram yearly

## 2016-10-14 LAB — CBC
HEMATOCRIT: 43.1 % (ref 34.0–46.6)
Hemoglobin: 14.4 g/dL (ref 11.1–15.9)
MCH: 32.7 pg (ref 26.6–33.0)
MCHC: 33.4 g/dL (ref 31.5–35.7)
MCV: 98 fL — AB (ref 79–97)
PLATELETS: 262 10*3/uL (ref 150–379)
RBC: 4.4 x10E6/uL (ref 3.77–5.28)
RDW: 13.1 % (ref 12.3–15.4)
WBC: 6.7 10*3/uL (ref 3.4–10.8)

## 2016-10-14 LAB — VITAMIN D 25 HYDROXY (VIT D DEFICIENCY, FRACTURES): VIT D 25 HYDROXY: 39.7 ng/mL (ref 30.0–100.0)

## 2016-10-14 LAB — COMPREHENSIVE METABOLIC PANEL
A/G RATIO: 2 (ref 1.2–2.2)
ALT: 17 IU/L (ref 0–32)
AST: 23 IU/L (ref 0–40)
Albumin: 4.7 g/dL (ref 3.6–4.8)
Alkaline Phosphatase: 98 IU/L (ref 39–117)
BUN/Creatinine Ratio: 17 (ref 12–28)
BUN: 14 mg/dL (ref 8–27)
Bilirubin Total: 0.4 mg/dL (ref 0.0–1.2)
CHLORIDE: 101 mmol/L (ref 96–106)
CO2: 26 mmol/L (ref 20–29)
Calcium: 9.7 mg/dL (ref 8.7–10.3)
Creatinine, Ser: 0.81 mg/dL (ref 0.57–1.00)
GFR, EST AFRICAN AMERICAN: 90 mL/min/{1.73_m2} (ref >59)
GFR, EST NON AFRICAN AMERICAN: 78 mL/min/{1.73_m2} (ref >59)
GLOBULIN, TOTAL: 2.3 g/dL (ref 1.5–4.5)
Glucose: 97 mg/dL (ref 65–99)
POTASSIUM: 4.2 mmol/L (ref 3.5–5.2)
SODIUM: 142 mmol/L (ref 134–144)
TOTAL PROTEIN: 7 g/dL (ref 6.0–8.5)

## 2016-10-14 LAB — LIPID PANEL
Chol/HDL Ratio: 2.3 ratio (ref 0.0–4.4)
Cholesterol, Total: 171 mg/dL (ref 100–199)
HDL: 74 mg/dL (ref >39)
LDL Calculated: 86 mg/dL (ref 0–99)
Triglycerides: 55 mg/dL (ref 0–149)
VLDL Cholesterol Cal: 11 mg/dL (ref 5–40)

## 2016-10-14 LAB — TSH: TSH: 1.04 u[IU]/mL (ref 0.450–4.500)

## 2016-10-17 ENCOUNTER — Telehealth: Payer: Self-pay | Admitting: Adult Health

## 2016-10-17 NOTE — Telephone Encounter (Signed)
Pt aware labs are great

## 2018-01-03 ENCOUNTER — Other Ambulatory Visit (HOSPITAL_COMMUNITY): Payer: Self-pay | Admitting: Internal Medicine

## 2018-01-03 DIAGNOSIS — Z1231 Encounter for screening mammogram for malignant neoplasm of breast: Secondary | ICD-10-CM

## 2018-11-28 ENCOUNTER — Other Ambulatory Visit (HOSPITAL_COMMUNITY): Payer: Self-pay | Admitting: Internal Medicine

## 2018-11-28 DIAGNOSIS — Z1231 Encounter for screening mammogram for malignant neoplasm of breast: Secondary | ICD-10-CM

## 2018-11-28 DIAGNOSIS — Z13820 Encounter for screening for osteoporosis: Secondary | ICD-10-CM

## 2018-12-25 DIAGNOSIS — M810 Age-related osteoporosis without current pathological fracture: Secondary | ICD-10-CM

## 2018-12-25 HISTORY — DX: Age-related osteoporosis without current pathological fracture: M81.0

## 2019-01-14 ENCOUNTER — Ambulatory Visit (HOSPITAL_COMMUNITY)
Admission: RE | Admit: 2019-01-14 | Discharge: 2019-01-14 | Disposition: A | Discharge status: Home / Self Care | Payer: BC Managed Care – PPO | Source: Ambulatory Visit | Attending: Internal Medicine | Admitting: Internal Medicine

## 2019-01-14 ENCOUNTER — Other Ambulatory Visit: Payer: Self-pay

## 2019-01-14 DIAGNOSIS — Z1231 Encounter for screening mammogram for malignant neoplasm of breast: Secondary | ICD-10-CM | POA: Insufficient documentation

## 2019-01-14 DIAGNOSIS — Z13820 Encounter for screening for osteoporosis: Secondary | ICD-10-CM

## 2019-03-26 ENCOUNTER — Ambulatory Visit: Payer: BC Managed Care – PPO | Attending: Internal Medicine

## 2019-03-26 ENCOUNTER — Telehealth: Payer: Self-pay | Admitting: Adult Health

## 2019-03-26 DIAGNOSIS — Z23 Encounter for immunization: Secondary | ICD-10-CM | POA: Insufficient documentation

## 2019-03-26 NOTE — Progress Notes (Signed)
   Covid-19 Vaccination Clinic  Name:  Whitney Alvarez    MRN: [id number] DOB: Oct 30, 1955  03/26/2019  Whitney Alvarez was observed post Covid-19 immunization for 15 minutes without incident. She was provided with Vaccine Information Sheet and instruction to access the V-Safe system.   Whitney Alvarez was instructed to call 911 with any severe reactions post vaccine: Marland Kitchen Difficulty breathing  . Swelling of face and throat  . A fast heartbeat  . A bad rash all over body  . Dizziness and weakness   Immunizations Administered    Name Date Dose VIS Date Route   Moderna COVID-19 Vaccine 03/26/2019 10:40 AM 0.5 mL 12/25/2018 Intramuscular   Manufacturer: Moderna   Lot: [id number]   [id number]

## 2019-03-26 NOTE — Telephone Encounter (Signed)

## 2019-03-27 ENCOUNTER — Encounter: Payer: Self-pay | Admitting: Adult Health

## 2019-03-27 ENCOUNTER — Ambulatory Visit (INDEPENDENT_AMBULATORY_CARE_PROVIDER_SITE_OTHER): Payer: BC Managed Care – PPO | Admitting: Adult Health

## 2019-03-27 ENCOUNTER — Other Ambulatory Visit (HOSPITAL_COMMUNITY)
Admission: RE | Admit: 2019-03-27 | Discharge: 2019-03-27 | Disposition: A | Discharge status: Home / Self Care | Payer: BC Managed Care – PPO | Source: Ambulatory Visit | Attending: Adult Health | Admitting: Adult Health

## 2019-03-27 ENCOUNTER — Other Ambulatory Visit: Payer: Self-pay

## 2019-03-27 VITALS — BP 121/73 | HR 71 | Ht 67.0 in | Wt 149.0 lb

## 2019-03-27 DIAGNOSIS — Z1211 Encounter for screening for malignant neoplasm of colon: Secondary | ICD-10-CM | POA: Diagnosis not present

## 2019-03-27 DIAGNOSIS — Z01419 Encounter for gynecological examination (general) (routine) without abnormal findings: Secondary | ICD-10-CM | POA: Insufficient documentation

## 2019-03-27 DIAGNOSIS — Z1212 Encounter for screening for malignant neoplasm of rectum: Secondary | ICD-10-CM

## 2019-03-27 LAB — HEMOCCULT GUIAC POC 1CARD (OFFICE): Fecal Occult Blood, POC: NEGATIVE

## 2019-03-27 NOTE — Progress Notes (Signed)
Patient ID: Whitney Alvarez, female   DOB: [birth year], 64 y.o.   MRN: [id number] History of Present Illness:  Whitney Alvarez is a 64 year old white female,divorced, G2P2, PM in for a well woman gyn exam and pap.She is a retried Pharmacist, hospital, but still subs, and is active.  PCP is Dr Nevada Crane.  Current Medications, Allergies, Past Medical History, Past Surgical History, Family History and Social History were reviewed in [employer] record.     Review of Systems: Patient denies any headaches, hearing loss, fatigue, blurred vision, shortness of breath, chest pain, abdominal pain, problems with bowel movements, urination, or intercourse. No joint pain or mood swings.    Physical Exam:BP 121/73 (BP Location: Left Arm, Patient Position: Sitting, Cuff Size: Normal)   Pulse 71   Ht 5\' 7"  (1.702 m)   Wt 149 lb (67.6 kg)   BMI 23.34 kg/m  General:  Well developed, well nourished, no acute distress Skin:  Warm and dry Neck:  Midline trachea, normal thyroid, good ROM, no lymphadenopathy,no carotid  bruits heard Lungs; Clear to auscultation bilaterally Breast:  No dominant palpable mass, retraction, or nipple discharge Cardiovascular: Regular rate and rhythm Abdomen:  Soft, non tender, no hepatosplenomegaly Pelvic:  External genitalia is normal in appearance, no lesions.  The vagina is pale with loss of moisture and rugae. Urethra has no lesions or masses. The cervix is smooth, pap with high risk HPV 16/18 genotyping performed.  Uterus is felt to be normal size, shape, and contour.  No adnexal masses or tenderness noted.Bladder is non tender, no masses felt. Rectal: Good sphincter tone, no polyps, or hemorrhoids felt.  Hemoccult negative. Extremities/musculoskeletal:  No swelling or varicosities noted, no clubbing or cyanosis Psych:  No mood changes, alert and cooperative,seems happy Fall risk is low PHQ 2 score 0 Examination chaperoned by Rolena Infante LPN  Impression and Plan:  1.  Encounter for gynecological examination with Papanicolaou smear of cervix Pap sent Physical and labs with PCP Pap in 3 years Mammogram yearly  Talk with PCP about medication for osteoporosis   2. Screening for colorectal cancer Colonoscopy per GI

## 2019-03-29 LAB — CYTOLOGY - PAP
Comment: NEGATIVE
Diagnosis: NEGATIVE
High risk HPV: NEGATIVE

## 2019-04-23 ENCOUNTER — Ambulatory Visit: Payer: BC Managed Care – PPO | Attending: Internal Medicine

## 2019-04-23 DIAGNOSIS — Z23 Encounter for immunization: Secondary | ICD-10-CM

## 2019-04-23 NOTE — Progress Notes (Signed)
   Covid-19 Vaccination Clinic  Name:  Whitney Alvarez    MRN: [id number] DOB: [birth year]  04/23/2019  Ms. Organ was observed post Covid-19 immunization for 15 minutes without incident. She was provided with Vaccine Information Sheet and instruction to access the V-Safe system.   [name] was instructed to call 911 with any severe reactions post vaccine: Marland Kitchen Difficulty breathing  . Swelling of face and throat  . A fast heartbeat  . A bad rash all over body  . Dizziness and weakness   Immunizations Administered    Name Date Dose VIS Date Route   Moderna COVID-19 Vaccine 04/23/2019 10:17 AM 0.5 mL 12/25/2018 Intramuscular   Manufacturer: Moderna   Lot: [id number]   [id number]

## 2020-02-11 ENCOUNTER — Ambulatory Visit
Admission: EM | Admit: 2020-02-11 | Discharge: 2020-02-11 | Disposition: A | Discharge status: Home / Self Care | Payer: Medicare PPO

## 2020-02-11 ENCOUNTER — Other Ambulatory Visit: Payer: Self-pay

## 2020-02-14 ENCOUNTER — Other Ambulatory Visit (HOSPITAL_COMMUNITY): Payer: Self-pay | Admitting: Internal Medicine

## 2020-02-14 DIAGNOSIS — Z1231 Encounter for screening mammogram for malignant neoplasm of breast: Secondary | ICD-10-CM

## 2020-02-24 ENCOUNTER — Ambulatory Visit (HOSPITAL_COMMUNITY)
Admission: RE | Admit: 2020-02-24 | Discharge: 2020-02-24 | Disposition: A | Discharge status: Home / Self Care | Payer: Medicare PPO | Source: Ambulatory Visit | Attending: Internal Medicine | Admitting: Internal Medicine

## 2020-02-24 ENCOUNTER — Other Ambulatory Visit: Payer: Self-pay

## 2020-02-24 DIAGNOSIS — Z1231 Encounter for screening mammogram for malignant neoplasm of breast: Secondary | ICD-10-CM | POA: Diagnosis present

## 2020-05-29 DIAGNOSIS — M5431 Sciatica, right side: Secondary | ICD-10-CM | POA: Diagnosis not present

## 2020-08-10 ENCOUNTER — Other Ambulatory Visit (HOSPITAL_COMMUNITY): Payer: Self-pay | Admitting: Family Medicine

## 2020-08-10 ENCOUNTER — Ambulatory Visit (HOSPITAL_COMMUNITY)
Admission: RE | Admit: 2020-08-10 | Discharge: 2020-08-10 | Disposition: A | Discharge status: Home / Self Care | Payer: Medicare PPO | Source: Ambulatory Visit | Attending: Family Medicine | Admitting: Family Medicine

## 2020-08-10 ENCOUNTER — Other Ambulatory Visit: Payer: Self-pay

## 2020-08-10 DIAGNOSIS — M25512 Pain in left shoulder: Secondary | ICD-10-CM

## 2020-08-10 DIAGNOSIS — M85822 Other specified disorders of bone density and structure, left upper arm: Secondary | ICD-10-CM | POA: Diagnosis not present

## 2020-08-10 DIAGNOSIS — M19012 Primary osteoarthritis, left shoulder: Secondary | ICD-10-CM | POA: Diagnosis not present

## 2020-08-10 DIAGNOSIS — M79622 Pain in left upper arm: Secondary | ICD-10-CM | POA: Diagnosis not present

## 2020-08-26 DIAGNOSIS — M25512 Pain in left shoulder: Secondary | ICD-10-CM | POA: Diagnosis not present

## 2020-09-29 ENCOUNTER — Encounter (HOSPITAL_COMMUNITY)
Admission: RE | Admit: 2020-09-29 | Discharge: 2020-09-29 | Disposition: A | Discharge status: Home / Self Care | Payer: Medicare PPO | Source: Ambulatory Visit | Attending: Internal Medicine | Admitting: Internal Medicine

## 2020-09-29 ENCOUNTER — Encounter (HOSPITAL_COMMUNITY): Payer: Self-pay

## 2020-09-29 ENCOUNTER — Other Ambulatory Visit: Payer: Self-pay

## 2020-09-29 DIAGNOSIS — M810 Age-related osteoporosis without current pathological fracture: Secondary | ICD-10-CM | POA: Insufficient documentation

## 2020-09-29 MED ORDER — DENOSUMAB 60 MG/ML ~~LOC~~ SOSY
60.0000 mg | PREFILLED_SYRINGE | Freq: Once | SUBCUTANEOUS | Status: AC
  Administered 2020-09-29: 60 mg via SUBCUTANEOUS

## 2020-12-31 DIAGNOSIS — Z0000 Encounter for general adult medical examination without abnormal findings: Secondary | ICD-10-CM | POA: Diagnosis not present

## 2021-01-07 ENCOUNTER — Other Ambulatory Visit (HOSPITAL_COMMUNITY): Payer: Self-pay | Admitting: Family Medicine

## 2021-01-07 DIAGNOSIS — E559 Vitamin D deficiency, unspecified: Secondary | ICD-10-CM | POA: Diagnosis not present

## 2021-01-07 DIAGNOSIS — E785 Hyperlipidemia, unspecified: Secondary | ICD-10-CM | POA: Diagnosis not present

## 2021-01-07 DIAGNOSIS — M810 Age-related osteoporosis without current pathological fracture: Secondary | ICD-10-CM | POA: Diagnosis not present

## 2021-01-07 DIAGNOSIS — Z23 Encounter for immunization: Secondary | ICD-10-CM | POA: Diagnosis not present

## 2021-01-07 DIAGNOSIS — E870 Hyperosmolality and hypernatremia: Secondary | ICD-10-CM | POA: Diagnosis not present

## 2021-01-07 DIAGNOSIS — Z0001 Encounter for general adult medical examination with abnormal findings: Secondary | ICD-10-CM | POA: Diagnosis not present

## 2021-02-04 ENCOUNTER — Other Ambulatory Visit: Payer: Self-pay

## 2021-02-04 ENCOUNTER — Ambulatory Visit (HOSPITAL_COMMUNITY)
Admission: RE | Admit: 2021-02-04 | Discharge: 2021-02-04 | Disposition: A | Discharge status: Home / Self Care | Payer: Medicare PPO | Source: Ambulatory Visit | Attending: Family Medicine | Admitting: Family Medicine

## 2021-02-04 DIAGNOSIS — M810 Age-related osteoporosis without current pathological fracture: Secondary | ICD-10-CM | POA: Insufficient documentation

## 2021-02-04 DIAGNOSIS — Z780 Asymptomatic menopausal state: Secondary | ICD-10-CM | POA: Diagnosis not present

## 2021-03-04 DIAGNOSIS — Z1283 Encounter for screening for malignant neoplasm of skin: Secondary | ICD-10-CM | POA: Diagnosis not present

## 2021-03-04 DIAGNOSIS — D225 Melanocytic nevi of trunk: Secondary | ICD-10-CM | POA: Diagnosis not present

## 2021-03-30 ENCOUNTER — Encounter (HOSPITAL_COMMUNITY)
Admission: RE | Admit: 2021-03-30 | Discharge: 2021-03-30 | Disposition: A | Discharge status: Home / Self Care | Payer: Medicare PPO | Source: Ambulatory Visit | Attending: Internal Medicine | Admitting: Internal Medicine

## 2021-03-30 MED ORDER — DENOSUMAB 60 MG/ML ~~LOC~~ SOSY: 60.0000 mg | PREFILLED_SYRINGE | Freq: Once | SUBCUTANEOUS | Status: DC

## 2021-07-20 DIAGNOSIS — M79671 Pain in right foot: Secondary | ICD-10-CM | POA: Diagnosis not present

## 2021-07-20 DIAGNOSIS — M67471 Ganglion, right ankle and foot: Secondary | ICD-10-CM | POA: Diagnosis not present

## 2021-09-29 ENCOUNTER — Encounter (HOSPITAL_COMMUNITY): Payer: Medicare PPO

## 2021-12-21 DIAGNOSIS — M898X7 Other specified disorders of bone, ankle and foot: Secondary | ICD-10-CM | POA: Diagnosis not present

## 2021-12-21 DIAGNOSIS — Z01818 Encounter for other preprocedural examination: Secondary | ICD-10-CM | POA: Diagnosis not present

## 2021-12-27 ENCOUNTER — Other Ambulatory Visit: Payer: Self-pay | Admitting: Podiatry

## 2021-12-29 ENCOUNTER — Encounter (HOSPITAL_COMMUNITY): Payer: Self-pay

## 2021-12-29 ENCOUNTER — Encounter (HOSPITAL_COMMUNITY)
Admission: RE | Admit: 2021-12-29 | Discharge: 2021-12-29 | Disposition: A | Discharge status: Home / Self Care | Payer: Medicare PPO | Source: Ambulatory Visit | Attending: Podiatry | Admitting: Podiatry

## 2021-12-29 DIAGNOSIS — Z01818 Encounter for other preprocedural examination: Secondary | ICD-10-CM | POA: Diagnosis not present

## 2021-12-29 NOTE — Patient Instructions (Signed)
Whitney Alvarez  12/29/2021     '@PREFPERIOPPHARMACY'$ @   Your procedure is scheduled on 12/31/2021.  Report to [facility] at 7:00 A.M.  Call this number if you have problems the morning of surgery:  [phone]  If you experience any cold or flu symptoms such as cough, fever, chills, shortness of breath, etc. between now and your scheduled surgery, please notify us at the above number.   Remember:  Do not eat or drink after midnight.      Take these medicines the morning of surgery with A SIP OF WATER : none    Do not wear jewelry, make-up or nail polish.  Do not wear lotions, powders, or perfumes, or deodorant.  Do not shave 48 hours prior to surgery.  Men may shave face and neck.  Do not bring valuables to the hospital.  [facility] is not responsible for any belongings or valuables.  Contacts, dentures or bridgework may not be worn into surgery.  Leave your suitcase in the car.  After surgery it may be brought to your room.  For patients admitted to the hospital, discharge time will be determined by your treatment team.  Patients discharged the day of surgery will not be allowed to drive home.   Name and phone number of your driver:   Family Special instructions:  n/a  Please read over the following fact sheets that you were given. Care and Recovery After Surgery  General Anesthesia, Adult General anesthesia is the use of medicine to make you fall asleep (unconscious) for a medical procedure. General anesthesia must be used for certain procedures. It is often recommended for surgery or procedures that: Last a long time. Require you to be still or in an unusual position. Are major and can cause blood loss. Affect your breathing. The medicines used for general anesthesia are called general anesthetics. During general anesthesia, these medicines are given along with medicines that: Prevent pain. Control your blood pressure. Relax your muscles. Prevent nausea and  vomiting after the procedure. Tell a health care provider about: Any allergies you have. All medicines you are taking, including vitamins, herbs, eye drops, creams, and over-the-counter medicines. Your history of any: Medical conditions you have, including: High blood pressure. Bleeding problems. Diabetes. Heart or lung conditions, such as: Heart failure. Sleep apnea. Asthma. Chronic obstructive pulmonary disease (COPD). Current or recent illnesses, such as: Upper respiratory, chest, or ear infections. Cough or fever. Tobacco or drug use, including marijuana or alcohol use. Depression or anxiety. Surgeries and types of anesthetics you have had. Problems you or family members have had with anesthetic medicines. Whether you are pregnant or may be pregnant. Whether you have any chipped or loose teeth, dentures, caps, bridgework, or issues with your mouth, swallowing, or choking. What are the risks? Your health care provider will talk with you about risks. These may include: Allergic reaction to the medicines. Lung and heart problems. Inhaling food or liquid from the stomach into the lungs (aspiration). Nerve injury. Injury to the lips, mouth, teeth, or gums. Stroke. Waking up during your procedure and being unable to move. This is rare. These problems are more likely to develop if you are having a major surgery or if you have an advanced or serious medical condition. You can prevent some of these complications by answering all of your health care provider's questions thoroughly and by following all instructions before your procedure. General anesthesia can cause side effects, including: Nausea or vomiting. A sore throat  or hoarseness from the breathing tube. Wheezing or coughing. Shaking chills or feeling cold. Body aches. Sleepiness. Confusion, agitation (delirium), or anxiety. What happens before the procedure? When to stop eating and drinking Follow instructions from your  health care provider about what you may eat and drink before your procedure. If you do not follow your health care provider's instructions, your procedure may be delayed or canceled. Medicines Ask your health care provider about: Changing or stopping your regular medicines. These include any diabetes medicines or blood thinners you take. Taking medicines such as aspirin and ibuprofen. These medicines can thin your blood. Do not take them unless your health care provider tells you to. Taking over-the-counter medicines, vitamins, herbs, and supplements. General instructions Do not use any products that contain nicotine or tobacco for at least 4 weeks before the procedure. These products include cigarettes, chewing tobacco, and vaping devices, such as e-cigarettes. If you need help quitting, ask your health care provider. If you brush your teeth on the morning of the procedure, make sure to spit out all of the water and toothpaste. If told by your health care provider, bring your sleep apnea device with you to surgery (if applicable). If you will be going home right after the procedure, plan to have a responsible adult: Take you home from the hospital or clinic. You will not be allowed to drive. Care for you for the time you are told. What happens during the procedure?  An IV will be inserted into one of your veins. You will be given one or more of the following through a face mask or IV: A sedative. This helps you relax. Anesthesia. This will: Numb certain areas of your body. Make you fall asleep for surgery. After you are unconscious, a breathing tube may be inserted down your throat to help you breathe. This will be removed before you wake up. An anesthesia provider, such as an anesthesiologist, will stay with you throughout your procedure. The anesthesia provider will: Keep you comfortable and safe by continuing to give you medicines and adjusting the amount of medicine that you  get. Monitor your blood pressure, heart rate, and oxygen levels to make sure that the anesthetics do not cause any problems. The procedure may vary among health care providers and hospitals. What happens after the procedure? Your blood pressure, temperature, heart rate, breathing rate, and blood oxygen level will be monitored until you leave the hospital or clinic. You will wake up in a recovery area. You may wake up slowly. You may be given medicine to help you with pain, nausea, or any other side effects from the anesthesia. Summary General anesthesia is the use of medicine to make you fall asleep (unconscious) for a medical procedure. Follow your health care provider's instructions about when to stop eating, drinking, or taking certain medicines before your procedure. Plan to have a responsible adult take you home from the hospital or clinic. This information is not intended to replace advice given to you by your health care provider. Make sure you discuss any questions you have with your health care provider. Document Revised: 04/08/2021 Document Reviewed: 04/08/2021 Elsevier Patient Education  [place].

## 2021-12-31 ENCOUNTER — Encounter (HOSPITAL_COMMUNITY): Payer: Self-pay | Admitting: Podiatry

## 2021-12-31 ENCOUNTER — Encounter (HOSPITAL_COMMUNITY)
Admission: RE | Disposition: A | Discharge status: Home / Self Care | Payer: Self-pay | Source: Home / Self Care | Attending: Podiatry

## 2021-12-31 ENCOUNTER — Ambulatory Visit (HOSPITAL_BASED_OUTPATIENT_CLINIC_OR_DEPARTMENT_OTHER): Payer: Medicare PPO | Admitting: Anesthesiology

## 2021-12-31 ENCOUNTER — Ambulatory Visit (HOSPITAL_COMMUNITY): Payer: Medicare PPO | Admitting: Anesthesiology

## 2021-12-31 ENCOUNTER — Ambulatory Visit (HOSPITAL_COMMUNITY)
Admission: RE | Admit: 2021-12-31 | Discharge: 2021-12-31 | Disposition: A | Discharge status: Home / Self Care | Payer: Medicare PPO | Attending: Podiatry | Admitting: Podiatry

## 2021-12-31 ENCOUNTER — Other Ambulatory Visit: Payer: Self-pay

## 2021-12-31 DIAGNOSIS — R2241 Localized swelling, mass and lump, right lower limb: Secondary | ICD-10-CM | POA: Diagnosis not present

## 2021-12-31 DIAGNOSIS — M7989 Other specified soft tissue disorders: Secondary | ICD-10-CM

## 2021-12-31 DIAGNOSIS — D2371 Other benign neoplasm of skin of right lower limb, including hip: Secondary | ICD-10-CM | POA: Diagnosis not present

## 2021-12-31 DIAGNOSIS — R2242 Localized swelling, mass and lump, left lower limb: Secondary | ICD-10-CM | POA: Diagnosis not present

## 2021-12-31 HISTORY — PX: MASS EXCISION: SHX2000

## 2021-12-31 SURGERY — EXCISION MASS
Anesthesia: General | Site: Foot | Laterality: Right

## 2021-12-31 MED ORDER — LACTATED RINGERS IV SOLN: INTRAVENOUS | Status: DC

## 2021-12-31 MED ORDER — PROPOFOL 10 MG/ML IV BOLUS
INTRAVENOUS | Status: DC | PRN
  Administered 2021-12-31: 20 mg via INTRAVENOUS
  Administered 2021-12-31: 10 mg via INTRAVENOUS

## 2021-12-31 MED ORDER — ORAL CARE MOUTH RINSE: 15.0000 mL | Freq: Once | OROMUCOSAL | Status: AC

## 2021-12-31 MED ORDER — BUPIVACAINE HCL (PF) 0.5 % IJ SOLN
INTRAMUSCULAR | Status: DC | PRN
  Administered 2021-12-31: 10 mL

## 2021-12-31 MED ORDER — CHLORHEXIDINE GLUCONATE CLOTH 2 % EX PADS: 6.0000 | MEDICATED_PAD | Freq: Once | CUTANEOUS | Status: DC

## 2021-12-31 MED ORDER — 0.9 % SODIUM CHLORIDE (POUR BTL) OPTIME
TOPICAL | Status: DC | PRN
  Administered 2021-12-31: 1000 mL

## 2021-12-31 MED ORDER — MEPERIDINE HCL 50 MG/ML IJ SOLN: 6.2500 mg | INTRAMUSCULAR | Status: DC | PRN

## 2021-12-31 MED ORDER — CEFAZOLIN SODIUM-DEXTROSE 2-4 GM/100ML-% IV SOLN
2.0000 g | INTRAVENOUS | Status: AC
  Administered 2021-12-31: 2 g via INTRAVENOUS
  Filled 2021-12-31: qty 100

## 2021-12-31 MED ORDER — PROPOFOL 500 MG/50ML IV EMUL
INTRAVENOUS | Status: DC | PRN
  Administered 2021-12-31: 75 ug/kg/min via INTRAVENOUS

## 2021-12-31 MED ORDER — LIDOCAINE HCL (PF) 2 % IJ SOLN
INTRAMUSCULAR | Status: AC
  Filled 2021-12-31: qty 5

## 2021-12-31 MED ORDER — BUPIVACAINE HCL (PF) 0.5 % IJ SOLN
INTRAMUSCULAR | Status: AC
  Filled 2021-12-31: qty 30

## 2021-12-31 MED ORDER — PROPOFOL 500 MG/50ML IV EMUL
INTRAVENOUS | Status: AC
  Filled 2021-12-31: qty 50

## 2021-12-31 MED ORDER — MIDAZOLAM HCL 5 MG/5ML IJ SOLN
INTRAMUSCULAR | Status: DC | PRN
  Administered 2021-12-31: 1 mg via INTRAVENOUS

## 2021-12-31 MED ORDER — ONDANSETRON HCL 4 MG/2ML IJ SOLN: 4.0000 mg | Freq: Once | INTRAMUSCULAR | Status: DC | PRN

## 2021-12-31 MED ORDER — FENTANYL CITRATE (PF) 100 MCG/2ML IJ SOLN
INTRAMUSCULAR | Status: DC | PRN
  Administered 2021-12-31 (×2): 25 ug via INTRAVENOUS

## 2021-12-31 MED ORDER — FENTANYL CITRATE (PF) 100 MCG/2ML IJ SOLN
INTRAMUSCULAR | Status: AC
  Filled 2021-12-31: qty 2

## 2021-12-31 MED ORDER — CHLORHEXIDINE GLUCONATE 0.12 % MT SOLN
15.0000 mL | Freq: Once | OROMUCOSAL | Status: AC
  Administered 2021-12-31: 15 mL via OROMUCOSAL
  Filled 2021-12-31: qty 15

## 2021-12-31 MED ORDER — MIDAZOLAM HCL 2 MG/2ML IJ SOLN
INTRAMUSCULAR | Status: AC
  Filled 2021-12-31: qty 2

## 2021-12-31 MED ORDER — ONDANSETRON HCL 4 MG/2ML IJ SOLN
INTRAMUSCULAR | Status: AC
  Filled 2021-12-31: qty 2

## 2021-12-31 MED ORDER — LIDOCAINE HCL (PF) 1 % IJ SOLN
INTRAMUSCULAR | Status: AC
  Filled 2021-12-31: qty 30

## 2021-12-31 MED ORDER — HYDROMORPHONE HCL 1 MG/ML IJ SOLN: 0.2500 mg | INTRAMUSCULAR | Status: DC | PRN

## 2021-12-31 MED ORDER — PROPOFOL 10 MG/ML IV BOLUS
INTRAVENOUS | Status: AC
  Filled 2021-12-31: qty 20

## 2021-12-31 MED ORDER — KETOROLAC TROMETHAMINE 30 MG/ML IJ SOLN
INTRAMUSCULAR | Status: DC | PRN
  Administered 2021-12-31: 30 mg via INTRAVENOUS

## 2021-12-31 MED ORDER — KETOROLAC TROMETHAMINE 30 MG/ML IJ SOLN
INTRAMUSCULAR | Status: AC
  Filled 2021-12-31: qty 2

## 2021-12-31 MED ORDER — ONDANSETRON HCL 4 MG/2ML IJ SOLN
INTRAMUSCULAR | Status: DC | PRN
  Administered 2021-12-31: 4 mg via INTRAVENOUS

## 2021-12-31 SURGICAL SUPPLY — 38 items
APL PRP STRL LF DISP 70% ISPRP (MISCELLANEOUS) ×1
APL SKNCLS STERI-STRIP NONHPOA (GAUZE/BANDAGES/DRESSINGS) ×1
BANDAGE ESMARK 4X12 BL STRL LF (DISPOSABLE) ×1 IMPLANT
BENZOIN TINCTURE PRP APPL 2/3 (GAUZE/BANDAGES/DRESSINGS) IMPLANT
BLADE SURG 15 STRL LF DISP TIS (BLADE) ×1 IMPLANT
BLADE SURG 15 STRL SS (BLADE) ×2
BNDG CMPR 12X4 ELC STRL LF (DISPOSABLE) ×1
BNDG CMPR STD VLCR NS LF 5.8X4 (GAUZE/BANDAGES/DRESSINGS) ×1
BNDG CONFORM 2 STRL LF (GAUZE/BANDAGES/DRESSINGS) ×1 IMPLANT
BNDG ELASTIC 4X5.8 VLCR NS LF (GAUZE/BANDAGES/DRESSINGS) ×1 IMPLANT
BNDG ESMARK 4X12 BLUE STRL LF (DISPOSABLE) ×1
BNDG GAUZE ELAST 4 BULKY (GAUZE/BANDAGES/DRESSINGS) ×1 IMPLANT
CHLORAPREP W/TINT 26 (MISCELLANEOUS) ×1 IMPLANT
CLOTH BEACON ORANGE TIMEOUT ST (SAFETY) ×1 IMPLANT
COVER LIGHT HANDLE STERIS (MISCELLANEOUS) ×2 IMPLANT
CUFF TOURN SGL QUICK 18X4 (TOURNIQUET CUFF) ×1 IMPLANT
DECANTER SPIKE VIAL GLASS SM (MISCELLANEOUS) ×2 IMPLANT
DRSG ADAPTIC 3X8 NADH LF (GAUZE/BANDAGES/DRESSINGS) ×1 IMPLANT
ELECT REM PT RETURN 9FT ADLT (ELECTROSURGICAL) ×1
ELECTRODE REM PT RTRN 9FT ADLT (ELECTROSURGICAL) ×1 IMPLANT
GAUZE SPONGE 4X4 12PLY STRL (GAUZE/BANDAGES/DRESSINGS) ×1 IMPLANT
GLOVE BIO SURGEON STRL SZ7.5 (GLOVE) ×1 IMPLANT
GLOVE BIOGEL PI IND STRL 7.0 (GLOVE) ×2 IMPLANT
GOWN STRL REUS W/ TWL LRG LVL3 (GOWN DISPOSABLE) ×1 IMPLANT
GOWN STRL REUS W/TWL LRG LVL3 (GOWN DISPOSABLE) ×5 IMPLANT
MANIFOLD NEPTUNE II (INSTRUMENTS) ×1 IMPLANT
NDL HYPO 27GX1-1/4 (NEEDLE) ×2 IMPLANT
NEEDLE HYPO 27GX1-1/4 (NEEDLE) ×2 IMPLANT
NS IRRIG 1000ML POUR BTL (IV SOLUTION) ×1 IMPLANT
PACK BASIC LIMB (CUSTOM PROCEDURE TRAY) ×1 IMPLANT
PAD ABD 5X9 TENDERSORB (GAUZE/BANDAGES/DRESSINGS) ×1 IMPLANT
PAD ARMBOARD 7.5X6 YLW CONV (MISCELLANEOUS) ×1 IMPLANT
SET BASIN LINEN APH (SET/KITS/TRAYS/PACK) ×1 IMPLANT
SPONGE T-LAP 18X18 ~~LOC~~+RFID (SPONGE) ×1 IMPLANT
STRIP CLOSURE SKIN 1/2X4 (GAUZE/BANDAGES/DRESSINGS) IMPLANT
SUT PROLENE 4 0 PS 2 18 (SUTURE) IMPLANT
SUT VICRYL 4-0 PS2 18IN ABS (SUTURE) IMPLANT
SYR CONTROL 10ML LL (SYRINGE) ×2 IMPLANT

## 2021-12-31 NOTE — Anesthesia Preprocedure Evaluation (Addendum)
Anesthesia Evaluation  Patient identified by MRN, date of birth, ID band Patient awake    Reviewed: Allergy & Precautions, H&P , NPO status , Patient's Chart, lab work & pertinent test results  Airway Mallampati: II  TM Distance: >3 FB Neck ROM: Full    Dental  (+) Dental Advisory Given   Pulmonary neg pulmonary ROS   Pulmonary exam normal breath sounds clear to auscultation       Cardiovascular negative cardio ROS Normal cardiovascular exam Rhythm:Regular Rate:Normal     Neuro/Psych negative neurological ROS  negative psych ROS   GI/Hepatic negative GI ROS, Neg liver ROS,,,  Endo/Other  negative endocrine ROS    Renal/GU negative Renal ROS  negative genitourinary   Musculoskeletal negative musculoskeletal ROS (+)    Abdominal   Peds negative pediatric ROS (+)  Hematology negative hematology ROS (+)   Anesthesia Other Findings   Reproductive/Obstetrics negative OB ROS                             Anesthesia Physical Anesthesia Plan  ASA: 2  Anesthesia Plan: General   Post-op Pain Management: Minimal or no pain anticipated   Induction: Intravenous  PONV Risk Score and Plan:   Airway Management Planned: Nasal Cannula, Natural Airway and Simple Face Mask  Additional Equipment:   Intra-op Plan:   Post-operative Plan:   Informed Consent: I have reviewed the patients History and Physical, chart, labs and discussed the procedure including the risks, benefits and alternatives for the proposed anesthesia with the patient or authorized representative who has indicated his/her understanding and acceptance.     Dental advisory given  Plan Discussed with: CRNA and Surgeon  Anesthesia Plan Comments: (Possible GA with airway was discussed.)       Anesthesia Quick Evaluation

## 2021-12-31 NOTE — Anesthesia Postprocedure Evaluation (Signed)
Anesthesia Post Note  Patient: Whitney Alvarez  Procedure(s) Performed: EXCISION RIGHT HEEL MASS (Right: Foot)  Patient location during evaluation: Phase II Anesthesia Type: General Level of consciousness: awake and alert and oriented Pain management: pain level controlled Vital Signs Assessment: post-procedure vital signs reviewed and stable Respiratory status: spontaneous breathing, nonlabored ventilation and respiratory function stable Cardiovascular status: blood pressure returned to baseline and stable Postop Assessment: no apparent nausea or vomiting Anesthetic complications: no  No notable events documented.   Last Vitals:  Vitals:   12/31/21 1010 12/31/21 1022  BP:  124/84  Pulse: 61 68  Resp: 16 14  Temp:  36.7 C  SpO2: 99% 99%    Last Pain:  Vitals:   12/31/21 1022  TempSrc: Oral  PainSc: 0-No pain                 Fredy Gladu C Jarrett [place]

## 2021-12-31 NOTE — Brief Op Note (Signed)
BRIEF OPERATIVE NOTE  DATE OF PROCEDURE 12/31/2021  SURGEON Marcheta Grammes, DPM  ASSISTANT SURGEON None  OR STAFF Circulator: Paulette Blanch, RN Scrub Person: Lucie Leather, CST; Cipriano Bunker, RN   PREOPERATIVE DIAGNOSIS Soft tissue mass, right heel  POSTOPERATIVE DIAGNOSIS Same  PROCEDURE Excision of soft tissue mass, right heel  ANESTHESIA Monitor Anesthesia Care   HEMOSTASIS Pneumatic ankle tourniquet set at 250 mmHg applied but not inflated.  ESTIMATED BLOOD LOSS Minimal (<10 cc)  MATERIALS USED None  INJECTABLES 0.5% Marcaine plain  PATHOLOGY Soft tissue mass, right heel  COMPLICATIONS None

## 2021-12-31 NOTE — Discharge Instructions (Signed)

## 2021-12-31 NOTE — Op Note (Signed)
OPERATIVE NOTE  DATE OF PROCEDURE 12/31/2021  SURGEON Marcheta Grammes, DPM  ASSISTANT SURGEON None  OR STAFF Circulator: Paulette Blanch, RN Scrub Person: Lucie Leather, CST; Cipriano Bunker, RN   PREOPERATIVE DIAGNOSIS Soft tissue mass, right heel  POSTOPERATIVE DIAGNOSIS Same  PROCEDURE Excision of soft tissue mass, right heel  ANESTHESIA Monitor Anesthesia Care   HEMOSTASIS Pneumatic ankle tourniquet set at 250 mmHg applied but not inflated.  ESTIMATED BLOOD LOSS Minimal (<10 cc)  MATERIALS USED None  INJECTABLES 0.5% Marcaine plain  PATHOLOGY Soft tissue mass, right heel  COMPLICATIONS None   INDICATIONS:  Symptomatic mass of the right heel.  DESCRIPTION OF THE PROCEDURE:  The patient was brought to the operating room and placed on the operative table in the supine position.  A pneumatic ankle tourniquet was placed about the patient's right ankle.  A timeout was performed.  The foot was anesthetized with 0.5% Marcaine plain.  The foot was scrubbed, prepped and draped in the usual sterile manner.  A second timeout was performed.  Attention was directed to the lateral aspect of the right heel.  A linear incision was made overlying the soft tissue mass.  Dissection was continued deep down to the level of the soft tissue mass.  The mass was well encapsulated and was not adherent to underlying structures.  The mass was removed and sent to pathology.  The surgical wound was inspected and found to be free of any remaining abnormal soft tissue pathology.  The surgical wound was irrigated with copious amounts of sterile saline.  The subcutaneous structures were reapproximated using 4-0 Vicryl.  The skin was reapproximated using 4-0 Prolene.  A sterile compressive dressing was applied to the right foot.  The patient tolerated the procedure well.  She was transferred from the operating room to the PACU with vital signs stable.

## 2021-12-31 NOTE — H&P (Signed)
HISTORY AND PHYSICAL INTERVAL NOTE:  12/31/2021  8:44 AM  Whitney Alvarez  has presented today for surgery, with the diagnosis of soft tissue mass right heel.  The various methods of treatment have been discussed with the patient.  No guarantees were given.  After consideration of risks, benefits and other options for treatment, the patient has consented to surgery.  I have reviewed the patients' chart and labs.    Patient Vitals for the past 24 hrs:  BP Temp Temp src Pulse Resp SpO2  12/31/21 0819 129/80 98.2 F (36.8 C) Oral 65 16 99 %    A history and physical examination was performed in my office.  The patient was reexamined.  There have been no changes to this history and physical examination.  Marcheta Grammes, DPM

## 2021-12-31 NOTE — Transfer of Care (Signed)
Immediate Anesthesia Transfer of Care Note  Patient: Whitney Alvarez  Procedure(s) Performed: EXCISION RIGHT HEEL MASS (Right: Foot)  Patient Location: PACU  Anesthesia Type:MAC  Level of Consciousness: awake, alert , and oriented  Airway & Oxygen Therapy: Patient Spontanous Breathing  Post-op Assessment: Report given to RN and Post -op Vital signs reviewed and stable  Post vital signs: Reviewed and stable  Last Vitals:  Vitals Value Taken Time  BP    Temp    Pulse    Resp    SpO2      Last Pain:  Vitals:   12/31/21 0819  TempSrc: Oral  PainSc: 0-No pain         Complications: No notable events documented.

## 2022-01-03 LAB — SURGICAL PATHOLOGY

## 2022-01-06 ENCOUNTER — Encounter (HOSPITAL_COMMUNITY): Payer: Self-pay | Admitting: Podiatry

## 2022-01-12 DIAGNOSIS — E785 Hyperlipidemia, unspecified: Secondary | ICD-10-CM | POA: Diagnosis not present

## 2022-01-12 DIAGNOSIS — E559 Vitamin D deficiency, unspecified: Secondary | ICD-10-CM | POA: Diagnosis not present

## 2022-01-19 DIAGNOSIS — Z0001 Encounter for general adult medical examination with abnormal findings: Secondary | ICD-10-CM | POA: Diagnosis not present

## 2022-01-19 DIAGNOSIS — E785 Hyperlipidemia, unspecified: Secondary | ICD-10-CM | POA: Diagnosis not present

## 2022-01-19 DIAGNOSIS — E870 Hyperosmolality and hypernatremia: Secondary | ICD-10-CM | POA: Diagnosis not present

## 2022-01-19 DIAGNOSIS — Z0000 Encounter for general adult medical examination without abnormal findings: Secondary | ICD-10-CM | POA: Diagnosis not present

## 2022-01-19 DIAGNOSIS — M810 Age-related osteoporosis without current pathological fracture: Secondary | ICD-10-CM | POA: Diagnosis not present

## 2022-01-19 DIAGNOSIS — E559 Vitamin D deficiency, unspecified: Secondary | ICD-10-CM | POA: Diagnosis not present

## 2022-01-19 DIAGNOSIS — M898X7 Other specified disorders of bone, ankle and foot: Secondary | ICD-10-CM | POA: Diagnosis not present

## 2022-05-02 ENCOUNTER — Other Ambulatory Visit (HOSPITAL_COMMUNITY)
Admission: RE | Admit: 2022-05-02 | Discharge: 2022-05-02 | Disposition: A | Discharge status: Home / Self Care | Payer: Medicare PPO | Source: Ambulatory Visit | Attending: Adult Health | Admitting: Adult Health

## 2022-05-02 ENCOUNTER — Encounter: Payer: Self-pay | Admitting: Adult Health

## 2022-05-02 ENCOUNTER — Ambulatory Visit (INDEPENDENT_AMBULATORY_CARE_PROVIDER_SITE_OTHER): Payer: Medicare PPO | Admitting: Adult Health

## 2022-05-02 VITALS — BP 104/70 | HR 86 | Ht 67.0 in | Wt 147.0 lb

## 2022-05-02 DIAGNOSIS — Z01419 Encounter for gynecological examination (general) (routine) without abnormal findings: Secondary | ICD-10-CM

## 2022-05-02 DIAGNOSIS — Z1211 Encounter for screening for malignant neoplasm of colon: Secondary | ICD-10-CM | POA: Diagnosis not present

## 2022-05-02 DIAGNOSIS — Z1231 Encounter for screening mammogram for malignant neoplasm of breast: Secondary | ICD-10-CM | POA: Diagnosis not present

## 2022-05-02 LAB — HEMOCCULT GUIAC POC 1CARD (OFFICE): Fecal Occult Blood, POC: NEGATIVE

## 2022-05-02 NOTE — Progress Notes (Signed)
Patient ID: Whitney Alvarez, female   DOB: 11/18/1955, 67 y.o.   MRN: [id number] History of Present Illness: Whitney Alvarez is a 67 year old white female,divorced, PM in for a well woman gyn exam and pap. She works PT.  PCP is Dr Margo Aye.   Current Medications, Allergies, Past Medical History, Past Surgical History, Family History and Social History were reviewed in [employer] record.     Review of Systems: Patient denies any headaches, hearing loss, fatigue, blurred vision, shortness of breath, chest pain, abdominal pain, problems with bowel movements, urination, or intercourse(has occasionally). No joint pain or mood swings.  Denies any vaginal bleeding    Physical Exam:BP 104/70 (BP Location: Left Arm, Patient Position: Sitting, Cuff Size: Normal)   Pulse 86   Ht 5\' 7"  (1.702 m)   Wt 147 lb (66.7 kg)   BMI 23.02 kg/m   General:  Well developed, well nourished, no acute distress Skin:  Warm and dry Neck:  Midline trachea, normal thyroid, good ROM, no lymphadenopathy,no carotid bruits heard Lungs; Clear to auscultation bilaterally Breast:  No dominant palpable mass, retraction, or nipple discharge Cardiovascular: Regular rate and rhythm Abdomen:  Soft, non tender, no hepatosplenomegaly Pelvic:  External genitalia is normal in appearance, no lesions.  The vagina is pale. Urethra has no lesions or masses. The cervix is smooth,pap with HR HPV genotyping performed.  Uterus is felt to be normal size, shape, and contour.  No adnexal masses or tenderness noted.Bladder is non tender, no masses felt. Rectal: Good sphincter tone, no polyps, or hemorrhoids felt.  Hemoccult negative. Extremities/musculoskeletal:  No swelling or varicosities noted, no clubbing or cyanosis Psych:  No mood changes, alert and cooperative,seems happy AA is 2 Fall risk is low    05/02/2022    1:33 PM 03/27/2019    8:46 AM 10/13/2016    2:31 PM  Depression screen PHQ 2/9  Decreased Interest 0 0 0  Down,  Depressed, Hopeless 0 0 0  PHQ - 2 Score 0 0 0  Altered sleeping 0    Tired, decreased energy 0    Change in appetite 0    Feeling bad or failure about yourself  0    Trouble concentrating 0    Moving slowly or fidgety/restless 0    Suicidal thoughts 0    PHQ-9 Score 0         05/02/2022    1:34 PM  GAD 7 : Generalized Anxiety Score  Nervous, Anxious, on Edge 0  Control/stop worrying 0  Worry too much - different things 0  Trouble relaxing 1  Restless 0  Easily annoyed or irritable 0  Afraid - awful might happen 0  Total GAD 7 Score 1    Upstream - 05/02/22 1338       Pregnancy Intention Screening   Does the patient want to become pregnant in the next year? No    Does the patient's partner want to become pregnant in the next year? No    Would the patient like to discuss contraceptive options today? No      Contraception Wrap Up   Current Method No Method - Other Reason   postmenopausal   Reason for No Current Contraceptive Method at Intake ([facility] Only) Other    End Method No Method - Other Reason   postmenopausal           Examination chaperoned by Malachy Mood LPN    Impression and Plan: 1. Encounter for gynecological examination  with Papanicolaou smear of cervix Pap sent If normal can be last pap GYN physical in  2 years  Labs with PCP Colonoscopy per GI - Cytology - PAP( [place]) Stay active  2. Encounter for screening fecal occult blood testing Hemoccult was negative  - POCT occult blood stool  3. Screening mammogram for breast cancer Pt to call for mammogram  - MM 3D SCREENING MAMMOGRAM BILATERAL BREAST; Future

## 2022-05-05 LAB — CYTOLOGY - PAP
Adequacy: ABSENT
Comment: NEGATIVE
Diagnosis: NEGATIVE
High risk HPV: NEGATIVE

## 2022-05-18 DIAGNOSIS — M7031 Other bursitis of elbow, right elbow: Secondary | ICD-10-CM | POA: Diagnosis not present

## 2022-05-18 DIAGNOSIS — Z6824 Body mass index (BMI) 24.0-24.9, adult: Secondary | ICD-10-CM | POA: Diagnosis not present

## 2022-05-23 ENCOUNTER — Ambulatory Visit (HOSPITAL_COMMUNITY)
Admission: RE | Admit: 2022-05-23 | Discharge: 2022-05-23 | Disposition: A | Discharge status: Home / Self Care | Payer: Medicare PPO | Source: Ambulatory Visit | Attending: Adult Health | Admitting: Adult Health

## 2022-05-23 ENCOUNTER — Encounter (HOSPITAL_COMMUNITY): Payer: Self-pay

## 2022-05-23 DIAGNOSIS — Z1231 Encounter for screening mammogram for malignant neoplasm of breast: Secondary | ICD-10-CM

## 2022-06-16 DIAGNOSIS — H93299 Other abnormal auditory perceptions, unspecified ear: Secondary | ICD-10-CM | POA: Diagnosis not present

## 2022-06-16 DIAGNOSIS — H93293 Other abnormal auditory perceptions, bilateral: Secondary | ICD-10-CM | POA: Diagnosis not present

## 2022-06-16 DIAGNOSIS — J302 Other seasonal allergic rhinitis: Secondary | ICD-10-CM | POA: Diagnosis not present

## 2022-07-19 ENCOUNTER — Ambulatory Visit
Admission: EM | Admit: 2022-07-19 | Discharge: 2022-07-19 | Disposition: A | Discharge status: Home / Self Care | Payer: Medicare PPO | Attending: Nurse Practitioner | Admitting: Nurse Practitioner

## 2022-07-19 DIAGNOSIS — Z23 Encounter for immunization: Secondary | ICD-10-CM

## 2022-07-19 DIAGNOSIS — S61213A Laceration without foreign body of left middle finger without damage to nail, initial encounter: Secondary | ICD-10-CM

## 2022-07-19 MED ORDER — TETANUS-DIPHTH-ACELL PERTUSSIS 5-2.5-18.5 LF-MCG/0.5 IM SUSY
0.5000 mL | PREFILLED_SYRINGE | Freq: Once | INTRAMUSCULAR | Status: AC
  Administered 2022-07-19: 0.5 mL via INTRAMUSCULAR

## 2022-07-19 NOTE — ED Provider Notes (Signed)
[facility]    CSN: [id number] Arrival date & time: 07/19/22  1408      History   Chief Complaint No chief complaint on file.   HPI Whitney Alvarez is a 67 y.o. female.   Patient presents today for left middle finger laceration that she sustained approximately 1 hour ago while cutting tomatoes in her kitchen.  Reports area is painful and bleeding.  She denies numbness or tingling in the fingertip or decreased range of motion of the finger.    Past Medical History:  Diagnosis Date   Hormone replacement therapy (HRT) 03/27/2013   Osteoporosis 12/2018    Patient Active Problem List   Diagnosis Date Noted   Screening for colorectal cancer 03/27/2019   Encounter for gynecological examination with Papanicolaou smear of cervix 03/27/2019   Encounter for well woman exam with routine gynecological exam 10/13/2016   Hormone replacement therapy (HRT) 03/27/2013    Past Surgical History:  Procedure Laterality Date   Quintella Reichert OSTEOTOMY Left 07/08/2015   Procedure: Quintella Reichert OSTEOTOMY LEFT GREAT TOE;  Surgeon: Ferman Hamming, DPM;  Location: [facility];  Service: Podiatry;  Laterality: Left;   BUNIONECTOMY Left 12/13/2012   Procedure: Serafina Royals;  Surgeon: Dallas Schimke, DPM;  Location: [facility];  Service: Orthopedics;  Laterality: Left;   BUNIONECTOMY Left 07/08/2015   Procedure: CLOSING BASE WEDGE 1ST METATARSAL LEFT FOOT;  Surgeon: Ferman Hamming, DPM;  Location: [facility];  Service: Podiatry;  Laterality: Left;   CESAREAN SECTION     x2   COLONOSCOPY     COLONOSCOPY N/A 02/10/2016   Procedure: COLONOSCOPY;  Surgeon: Malissa Hippo, MD;  Location: [facility];  Service: Endoscopy;  Laterality: N/A;  1030 - moved to 02/10/16 @ 10:30 per Dewayne Hatch   FOOT ARTHRODESIS Left 07/08/2015   Procedure: ARTHRODESIS METATARSOPHALANGEAL JOINT WITH AN ALLOGRAFT LEFT FOOT;  Surgeon: Ferman Hamming, DPM;  Location: [facility];  Service: Podiatry;  Laterality: Left;   MASS EXCISION  Right 12/31/2021   Procedure: EXCISION RIGHT HEEL MASS;  Surgeon: Ferman Hamming, DPM;  Location: [facility];  Service: Podiatry;  Laterality: Right;   METATARSAL HEAD EXCISION Left 12/13/2012   Procedure: 2ND METATARSAL HEAD RESECTION LEFT FOOT;  Surgeon: Dallas Schimke, DPM;  Location: [facility];  Service: Orthopedics;  Laterality: Left;   PROXIMAL INTERPHALANGEAL FUSION (PIP) Left 12/13/2012   Procedure: PROXIMAL INTERPHALANGEAL JOINT FUSION (PIP) 2ND TOE LEFT FOOT;  Surgeon: Dallas Schimke, DPM;  Location: [facility];  Service: Orthopedics;  Laterality: Left;    OB History     Gravida  2   Para  2   Term      Preterm      AB      Living  2      SAB      IAB      Ectopic      Multiple      Live Births               Home Medications    Prior to Admission medications   Medication Sig Start Date End Date Taking? Authorizing Provider  Ascorbic Acid (VITAMIN C) 1000 MG tablet Take 1,000 mg by mouth daily.    [provider]  calcium carbonate (OSCAL) 1500 (600 Ca) MG TABS tablet Take 600 mg of elemental calcium by mouth daily.    [provider]  Cholecalciferol (VITAMIN D) 125 MCG (5000 UT) CAPS Take 5,000 Units by mouth every other day.  [provider]  magnesium oxide (MAG-OX) 400 (240 Mg) MG tablet Take 400 mg by mouth daily.    [provider]  zinc gluconate 50 MG tablet Take 50 mg by mouth daily.    [provider]    Family History Family History  Problem Relation Age of Onset   Cancer Mother        bladder   Cancer Father        lymphoma, bone marrow    Hypertension Brother    COPD Maternal Aunt    Cancer Paternal Uncle    Cancer Maternal Grandfather    Colon cancer Neg Hx     Social History Social History   Tobacco Use   Smoking status: Never   Smokeless tobacco: Never  Vaping Use   Vaping Use: Never used  Substance Use Topics   Alcohol use: Yes    Comment: occasional   Drug  use: No     Allergies   Patient has no known allergies.   Review of Systems Review of Systems Per HPI  Physical Exam Triage Vital Signs ED Triage Vitals  Enc Vitals Group     BP 07/19/22 1418 114/75     Pulse Rate 07/19/22 1418 77     Resp 07/19/22 1418 16     Temp 07/19/22 1418 98.1 F (36.7 C)     Temp Source 07/19/22 1418 Oral     SpO2 07/19/22 1418 94 %     Weight --      Height --      Head Circumference --      Peak Flow --      Pain Score 07/19/22 1415 4     Pain Loc --      Pain Edu? --      Excl. in GC? --    No data found.  Updated Vital Signs BP 114/75 (BP Location: Right Arm)   Pulse 77   Temp 98.1 F (36.7 C) (Oral)   Resp 16   SpO2 94%   Visual Acuity Right Eye Distance:   Left Eye Distance:   Bilateral Distance:    Right Eye Near:   Left Eye Near:    Bilateral Near:     Physical Exam Vitals and nursing note reviewed.  Constitutional:      General: She is not in acute distress.    Appearance: Normal appearance. She is not toxic-appearing.  HENT:     Mouth/Throat:     Mouth: Mucous membranes are moist.     Pharynx: Oropharynx is clear.  Pulmonary:     Effort: Pulmonary effort is normal. No respiratory distress.  Skin:    General: Skin is warm and dry.     Capillary Refill: Capillary refill takes less than 2 seconds.     Findings: Laceration present.     Comments: Approximately 1.5 cm long laceration to distal left third digit anteriorly.  Patient is finger is neurovascularly intact distal to the wound.  Patient has full range of motion of the third digit of the left hand.  Neurological:     Mental Status: She is alert and oriented to person, place, and time.  Psychiatric:        Behavior: Behavior is cooperative.      UC Treatments / Results  Labs (all labs ordered are listed, but only abnormal results are displayed) Labs Reviewed - No data to display  EKG   Radiology No results found.  Procedures Laceration  Repair  Date/Time: 07/19/2022 3:16 PM  Performed by: Valentino Nose, NP Authorized by: Valentino Nose, NP   Consent:    Consent obtained:  Verbal   Consent given by:  Patient   Risks, benefits, and alternatives were discussed: yes     Risks discussed:  Infection, pain and poor cosmetic result   Alternatives discussed:  Delayed treatment Universal protocol:    Procedure explained and questions answered to patient or proxy's satisfaction: yes     Patient identity confirmed:  Verbally with patient Anesthesia:    Anesthesia method:  None Laceration details:    Location:  Finger   Finger location:  L long finger   Length (cm):  1.5 Exploration:    Limited defect created (wound extended): no     Hemostasis achieved with:  Direct pressure   Wound exploration: wound explored through full range of motion     Wound extent: no foreign bodies/material noted, no muscle damage noted, no nerve damage noted, no tendon damage noted, no underlying fracture noted and no vascular damage noted     Contaminated: no   Treatment:    Area cleansed with:  Chlorhexidine   Amount of cleaning:  Standard   Debridement:  None Skin repair:    Repair method:  Tissue adhesive Approximation:    Approximation:  Close Repair type:    Repair type:  Simple Post-procedure details:    Dressing:  Splint for protection   Procedure completion:  Tolerated well, no immediate complications  (including critical care time)  Medications Ordered in UC Medications  Tdap (BOOSTRIX) injection 0.5 mL (0.5 mLs Intramuscular Given 07/19/22 1452)    Initial Impression / Assessment and Plan / UC Course  I have reviewed the triage vital signs and the nursing notes.  Pertinent labs & imaging results that were available during my care of the patient were reviewed by me and considered in my medical decision making (see chart for details).   Patient is well-appearing, normotensive, afebrile, not tachycardic, not  tachypneic, oxygenating well on room air.    1. Laceration of left middle finger without foreign body without damage to nail, initial encounter Laceration repaired with tissue adhesive as above Patient with full range of motion and sensation in the distal fingertip thereafter Wound care discussed Signs and symptoms of infection discussed and discussed when to return to urgent care discussed Tdap updated today  The patient was given the opportunity to ask questions.  All questions answered to their satisfaction.  The patient is in agreement to this plan.    Final Clinical Impressions(s) / UC Diagnoses   Final diagnoses:  Laceration of left middle finger without foreign body without damage to nail, initial encounter     Discharge Instructions      We closed the cut on your finger today with skin glue or Dermabond.  This will fall off on its own in a few days.  Please keep the area clean and dry.  You can get it wet without the glue falling off.  Use a finger splint to help keep the area protected when you are not washing your hands or in the shower.  Tdap has been updated today    ED Prescriptions   None    PDMP not reviewed this encounter.   Valentino Nose, NP 07/19/22 [phone]

## 2022-07-19 NOTE — Discharge Instructions (Addendum)
We closed the cut on your finger today with skin glue or Dermabond.  This will fall off on its own in a few days.  Please keep the area clean and dry.  You can get it wet without the glue falling off.  Use a finger splint to help keep the area protected when you are not washing your hands or in the shower.  Tdap has been updated today

## 2022-07-19 NOTE — ED Triage Notes (Addendum)
Pt reports she cut her left middle finger cutting tomatoes about 20 mintues ago. Last tetanus unknown

## 2022-07-19 NOTE — ED Notes (Signed)
Pt's left middle finger is being soaked in hibiclens solution and warm water

## 2022-10-06 ENCOUNTER — Encounter: Payer: Self-pay | Admitting: Emergency Medicine

## 2022-10-06 ENCOUNTER — Ambulatory Visit
Admission: EM | Admit: 2022-10-06 | Discharge: 2022-10-06 | Disposition: A | Discharge status: Home / Self Care | Payer: Medicare PPO | Attending: Family Medicine | Admitting: Family Medicine

## 2022-10-06 ENCOUNTER — Other Ambulatory Visit: Payer: Self-pay

## 2022-10-06 DIAGNOSIS — R1032 Left lower quadrant pain: Secondary | ICD-10-CM

## 2022-10-06 DIAGNOSIS — R319 Hematuria, unspecified: Secondary | ICD-10-CM

## 2022-10-06 LAB — POCT URINALYSIS DIP (MANUAL ENTRY)
Bilirubin, UA: NEGATIVE
Glucose, UA: NEGATIVE mg/dL
Leukocytes, UA: NEGATIVE
Nitrite, UA: NEGATIVE
Protein Ur, POC: NEGATIVE mg/dL
Spec Grav, UA: 1.02 (ref 1.010–1.025)
Urobilinogen, UA: 0.2 U/dL
pH, UA: 6 (ref 5.0–8.0)

## 2022-10-06 MED ORDER — KETOROLAC TROMETHAMINE 30 MG/ML IJ SOLN
30.0000 mg | Freq: Once | INTRAMUSCULAR | Status: AC
  Administered 2022-10-06: 30 mg via INTRAMUSCULAR

## 2022-10-06 MED ORDER — TAMSULOSIN HCL 0.4 MG PO CAPS: 0.4000 mg | ORAL_CAPSULE | Freq: Every day | ORAL | 0 refills | Status: AC

## 2022-10-06 NOTE — Discharge Instructions (Addendum)
Your labs should be back tomorrow and someone will call with abnormal results.  I am suspicious for a kidney stone at this time, however we cannot perform a CT scan to prove or rule this out in the setting.  We have given you a shot of Toradol which is a strong anti-inflammatory pain medication.  Do not take any ibuprofen or Aleve for the next 48 hours though you may take Tylenol as needed for breakthrough pain.  The Flomax that I have sent to the pharmacy should help dilate urinary tubes in case it is a stone that needs to pass.  Follow-up with your primary care provider as soon as possible for further evaluation and go to the emergency department if worsening anytime.

## 2022-10-06 NOTE — ED Triage Notes (Signed)
Pt reports intermittent LLQ pain since yesterday. Pt reports has taken otc tylenol that has helped but reports pain is now constant. States improvement of pain to "rub that area" and bend over. Reports last BM was yesterday but reports "not as much as usual."

## 2022-10-06 NOTE — ED Provider Notes (Signed)
[facility]    CSN: [id number] Arrival date & time: 10/06/22  0915      History   Chief Complaint Chief Complaint  Patient presents with   Abdominal Pain    HPI Whitney Alvarez is a 67 y.o. female.   Patient presenting today with 1 day history of left lower quadrant pain.  Had some associated nausea last night but no vomiting.  She states the pain seems worse in certain positions, improved with pressing on the area or bending forward.  Denies associated fevers, chills, vomiting, significant bowel changes, melena, upper respiratory symptoms COVID new foods or medications.  Last bowel movement was yesterday.  So far not trying anything over-the-counter for symptoms.  No past history of similar issues.    Past Medical History:  Diagnosis Date   Hormone replacement therapy (HRT) 03/27/2013   Osteoporosis 12/2018    Patient Active Problem List   Diagnosis Date Noted   Screening for colorectal cancer 03/27/2019   Encounter for gynecological examination with Papanicolaou smear of cervix 03/27/2019   Encounter for well woman exam with routine gynecological exam 10/13/2016   Hormone replacement therapy (HRT) 03/27/2013    Past Surgical History:  Procedure Laterality Date   Quintella Reichert OSTEOTOMY Left 07/08/2015   Procedure: Quintella Reichert OSTEOTOMY LEFT GREAT TOE;  Surgeon: Ferman Hamming, DPM;  Location: [facility];  Service: Podiatry;  Laterality: Left;   BUNIONECTOMY Left 12/13/2012   Procedure: Serafina Royals;  Surgeon: Dallas Schimke, DPM;  Location: [facility];  Service: Orthopedics;  Laterality: Left;   BUNIONECTOMY Left 07/08/2015   Procedure: CLOSING BASE WEDGE 1ST METATARSAL LEFT FOOT;  Surgeon: Ferman Hamming, DPM;  Location: [facility];  Service: Podiatry;  Laterality: Left;   CESAREAN SECTION     x2   COLONOSCOPY     COLONOSCOPY N/A 02/10/2016   Procedure: COLONOSCOPY;  Surgeon: Malissa Hippo, MD;  Location: [facility];  Service: Endoscopy;  Laterality: N/A;   1030 - moved to 02/10/16 @ 10:30 per Dewayne Hatch   FOOT ARTHRODESIS Left 07/08/2015   Procedure: ARTHRODESIS METATARSOPHALANGEAL JOINT WITH AN ALLOGRAFT LEFT FOOT;  Surgeon: Ferman Hamming, DPM;  Location: [facility];  Service: Podiatry;  Laterality: Left;   MASS EXCISION Right 12/31/2021   Procedure: EXCISION RIGHT HEEL MASS;  Surgeon: Ferman Hamming, DPM;  Location: [facility];  Service: Podiatry;  Laterality: Right;   METATARSAL HEAD EXCISION Left 12/13/2012   Procedure: 2ND METATARSAL HEAD RESECTION LEFT FOOT;  Surgeon: Dallas Schimke, DPM;  Location: [facility];  Service: Orthopedics;  Laterality: Left;   PROXIMAL INTERPHALANGEAL FUSION (PIP) Left 12/13/2012   Procedure: PROXIMAL INTERPHALANGEAL JOINT FUSION (PIP) 2ND TOE LEFT FOOT;  Surgeon: Dallas Schimke, DPM;  Location: [facility];  Service: Orthopedics;  Laterality: Left;    OB History     Gravida  2   Para  2   Term      Preterm      AB      Living  2      SAB      IAB      Ectopic      Multiple      Live Births               Home Medications    Prior to Admission medications   Medication Sig Start Date End Date Taking? Authorizing Provider  tamsulosin (FLOMAX) 0.4 MG CAPS capsule Take 1 capsule (0.4 mg total) by mouth daily. 10/06/22  Yes Roosvelt Maser  Lanora Manis, PA-C  Ascorbic Acid (VITAMIN C) 1000 MG tablet Take 1,000 mg by mouth daily.    [provider]  calcium carbonate (OSCAL) 1500 (600 Ca) MG TABS tablet Take 600 mg of elemental calcium by mouth daily.    [provider]  Cholecalciferol (VITAMIN D) 125 MCG (5000 UT) CAPS Take 5,000 Units by mouth every other day.    [provider]  magnesium oxide (MAG-OX) 400 (240 Mg) MG tablet Take 400 mg by mouth daily.    [provider]  zinc gluconate 50 MG tablet Take 50 mg by mouth daily.    [provider]    Family History Family History  Problem Relation Age of Onset   Cancer Mother        bladder    Cancer Father        lymphoma, bone marrow    Hypertension Brother    COPD Maternal Aunt    Cancer Paternal Uncle    Cancer Maternal Grandfather    Colon cancer Neg Hx     Social History Social History   Tobacco Use   Smoking status: Never   Smokeless tobacco: Never  Vaping Use   Vaping status: Never Used  Substance Use Topics   Alcohol use: Yes    Comment: occasional   Drug use: No     Allergies   Patient has no known allergies.   Review of Systems Review of Systems Per HPI  Physical Exam Triage Vital Signs ED Triage Vitals  Encounter Vitals Group     BP 10/06/22 1040 (!) 147/97     Systolic BP Percentile --      Diastolic BP Percentile --      Pulse Rate 10/06/22 1040 69     Resp 10/06/22 1040 20     Temp 10/06/22 1040 98.7 F (37.1 C)     Temp Source 10/06/22 1040 Oral     SpO2 10/06/22 1040 97 %     Weight --      Height --      Head Circumference --      Peak Flow --      Pain Score 10/06/22 1038 5     Pain Loc --      Pain Education --      Exclude from Growth Chart --    No data found.  Updated Vital Signs BP (!) 147/97 (BP Location: Right Arm)   Pulse 69   Temp 98.7 F (37.1 C) (Oral)   Resp 20   SpO2 97%   Visual Acuity Right Eye Distance:   Left Eye Distance:   Bilateral Distance:    Right Eye Near:   Left Eye Near:    Bilateral Near:     Physical Exam Vitals and nursing note reviewed.  Constitutional:      Appearance: Normal appearance. She is not ill-appearing.  HENT:     Head: Atraumatic.     Mouth/Throat:     Mouth: Mucous membranes are moist.  Eyes:     Extraocular Movements: Extraocular movements intact.     Conjunctiva/sclera: Conjunctivae normal.  Cardiovascular:     Rate and Rhythm: Normal rate and regular rhythm.     Heart sounds: Normal heart sounds.  Pulmonary:     Effort: Pulmonary effort is normal.     Breath sounds: Normal breath sounds.  Abdominal:     General: Bowel sounds are normal. There is no  distension.     Palpations: Abdomen  is soft.     Tenderness: There is abdominal tenderness. There is no right CVA tenderness, left CVA tenderness or guarding.     Comments: Mild left lower quadrant tenderness to palpation without distention or guarding  Musculoskeletal:        General: Normal range of motion.     Cervical back: Normal range of motion and neck supple.  Skin:    General: Skin is warm and dry.  Neurological:     Mental Status: She is alert and oriented to person, place, and time.  Psychiatric:        Mood and Affect: Mood normal.        Thought Content: Thought content normal.        Judgment: Judgment normal.      UC Treatments / Results  Labs (all labs ordered are listed, but only abnormal results are displayed) Labs Reviewed  POCT URINALYSIS DIP (MANUAL ENTRY) - Abnormal; Notable for the following components:      Result Value   Ketones, POC UA large (80) (*)    Blood, UA trace-intact (*)    All other components within normal limits  COMPREHENSIVE METABOLIC PANEL  CBC WITH DIFFERENTIAL/PLATELET    EKG   Radiology No results found.  Procedures Procedures (including critical care time)  Medications Ordered in UC Medications  ketorolac (TORADOL) 30 MG/ML injection 30 mg (30 mg Intramuscular Given 10/06/22 1145)    Initial Impression / Assessment and Plan / UC Course  I have reviewed the triage vital signs and the nursing notes.  Pertinent labs & imaging results that were available during my care of the patient were reviewed by me and considered in my medical decision making (see chart for details).     Urinalysis with blood, no evidence of UTI.  Given the location of her pain and the hematuria suspect kidney stone, labs pending for further evaluation and other considerations may be constipation, diverticulitis, musculoskeletal pain.  Discussed IM Toradol for pain, Flomax and close PCP follow-up for further evaluation.  Final Clinical Impressions(s)  / UC Diagnoses   Final diagnoses:  LLQ pain  Hematuria, unspecified type     Discharge Instructions      Your labs should be back tomorrow and someone will call with abnormal results.  I am suspicious for a kidney stone at this time, however we cannot perform a CT scan to prove or rule this out in the setting.  We have given you a shot of Toradol which is a strong anti-inflammatory pain medication.  Do not take any ibuprofen or Aleve for the next 48 hours though you may take Tylenol as needed for breakthrough pain.  The Flomax that I have sent to the pharmacy should help dilate urinary tubes in case it is a stone that needs to pass.  Follow-up with your primary care provider as soon as possible for further evaluation and go to the emergency department if worsening anytime.    ED Prescriptions     Medication Sig Dispense Auth. Provider   tamsulosin (FLOMAX) 0.4 MG CAPS capsule Take 1 capsule (0.4 mg total) by mouth daily. 14 capsule Particia Nearing, [place]      PDMP not reviewed this encounter.   Particia Nearing, [place] 10/06/22 1758

## 2022-10-07 ENCOUNTER — Other Ambulatory Visit (HOSPITAL_COMMUNITY): Payer: Self-pay | Admitting: Family Medicine

## 2022-10-07 DIAGNOSIS — R1032 Left lower quadrant pain: Secondary | ICD-10-CM | POA: Diagnosis not present

## 2022-10-07 LAB — COMPREHENSIVE METABOLIC PANEL
ALT: 13 IU/L (ref 0–32)
AST: 17 IU/L (ref 0–40)
Albumin: 4.8 g/dL (ref 3.9–4.9)
Alkaline Phosphatase: 113 IU/L (ref 44–121)
BUN/Creatinine Ratio: 15 (ref 12–28)
BUN: 10 mg/dL (ref 8–27)
Bilirubin Total: 0.4 mg/dL (ref 0.0–1.2)
CO2: 23 mmol/L (ref 20–29)
Calcium: 9.5 mg/dL (ref 8.7–10.3)
Chloride: 105 mmol/L (ref 96–106)
Creatinine, Ser: 0.65 mg/dL (ref 0.57–1.00)
Globulin, Total: 2.3 g/dL (ref 1.5–4.5)
Glucose: 110 mg/dL — ABNORMAL HIGH (ref 70–99)
Potassium: 4.3 mmol/L (ref 3.5–5.2)
Sodium: 143 mmol/L (ref 134–144)
Total Protein: 7.1 g/dL (ref 6.0–8.5)
eGFR: 96 mL/min/{1.73_m2} (ref >59)

## 2022-10-07 LAB — CBC WITH DIFFERENTIAL/PLATELET
Basophils Absolute: 0 10*3/uL (ref 0.0–0.2)
Basos: 0 %
EOS (ABSOLUTE): 0 10*3/uL (ref 0.0–0.4)
Eos: 0 %
Hematocrit: 43.7 % (ref 34.0–46.6)
Hemoglobin: 14.6 g/dL (ref 11.1–15.9)
Immature Grans (Abs): 0 10*3/uL (ref 0.0–0.1)
Immature Granulocytes: 0 %
Lymphocytes Absolute: 0.6 10*3/uL — ABNORMAL LOW (ref 0.7–3.1)
Lymphs: 12 %
MCH: 32.6 pg (ref 26.6–33.0)
MCHC: 33.4 g/dL (ref 31.5–35.7)
MCV: 98 fL — ABNORMAL HIGH (ref 79–97)
Monocytes Absolute: 0.3 10*3/uL (ref 0.1–0.9)
Monocytes: 5 %
Neutrophils Absolute: 4.3 10*3/uL (ref 1.4–7.0)
Neutrophils: 83 %
Platelets: 213 10*3/uL (ref 150–450)
RBC: 4.48 x10E6/uL (ref 3.77–5.28)
RDW: 12.1 % (ref 11.7–15.4)
WBC: 5.2 10*3/uL (ref 3.4–10.8)

## 2022-10-10 ENCOUNTER — Ambulatory Visit (HOSPITAL_COMMUNITY)
Admission: RE | Admit: 2022-10-10 | Discharge: 2022-10-10 | Disposition: A | Discharge status: Home / Self Care | Payer: Medicare PPO | Source: Ambulatory Visit | Attending: Family Medicine | Admitting: Family Medicine

## 2022-10-10 ENCOUNTER — Encounter (HOSPITAL_COMMUNITY): Payer: Self-pay | Admitting: Radiology

## 2022-10-10 DIAGNOSIS — R3129 Other microscopic hematuria: Secondary | ICD-10-CM | POA: Diagnosis not present

## 2022-10-10 DIAGNOSIS — R932 Abnormal findings on diagnostic imaging of liver and biliary tract: Secondary | ICD-10-CM | POA: Diagnosis not present

## 2022-10-10 DIAGNOSIS — R1032 Left lower quadrant pain: Secondary | ICD-10-CM | POA: Diagnosis not present

## 2022-10-10 MED ORDER — IOHEXOL 300 MG/ML  SOLN
100.0000 mL | Freq: Once | INTRAMUSCULAR | Status: AC | PRN
  Administered 2022-10-10: 100 mL via INTRAVENOUS

## 2023-03-08 DIAGNOSIS — E559 Vitamin D deficiency, unspecified: Secondary | ICD-10-CM | POA: Diagnosis not present

## 2023-03-08 DIAGNOSIS — E785 Hyperlipidemia, unspecified: Secondary | ICD-10-CM | POA: Diagnosis not present

## 2023-03-13 DIAGNOSIS — Z23 Encounter for immunization: Secondary | ICD-10-CM | POA: Diagnosis not present

## 2023-03-13 DIAGNOSIS — R1032 Left lower quadrant pain: Secondary | ICD-10-CM | POA: Diagnosis not present

## 2023-03-13 DIAGNOSIS — Z0000 Encounter for general adult medical examination without abnormal findings: Secondary | ICD-10-CM | POA: Diagnosis not present

## 2023-03-13 DIAGNOSIS — E785 Hyperlipidemia, unspecified: Secondary | ICD-10-CM | POA: Diagnosis not present

## 2023-03-13 DIAGNOSIS — Z79899 Other long term (current) drug therapy: Secondary | ICD-10-CM | POA: Diagnosis not present

## 2023-03-13 DIAGNOSIS — M810 Age-related osteoporosis without current pathological fracture: Secondary | ICD-10-CM | POA: Diagnosis not present

## 2023-03-13 DIAGNOSIS — E559 Vitamin D deficiency, unspecified: Secondary | ICD-10-CM | POA: Diagnosis not present

## 2023-05-25 ENCOUNTER — Other Ambulatory Visit (HOSPITAL_COMMUNITY): Payer: Self-pay | Admitting: Nurse Practitioner

## 2023-05-25 DIAGNOSIS — M810 Age-related osteoporosis without current pathological fracture: Secondary | ICD-10-CM

## 2023-05-25 DIAGNOSIS — Z1231 Encounter for screening mammogram for malignant neoplasm of breast: Secondary | ICD-10-CM

## 2023-06-01 ENCOUNTER — Encounter (HOSPITAL_COMMUNITY): Payer: Self-pay

## 2023-06-08 ENCOUNTER — Ambulatory Visit (HOSPITAL_COMMUNITY)

## 2023-06-08 ENCOUNTER — Other Ambulatory Visit (HOSPITAL_COMMUNITY)

## 2023-06-12 ENCOUNTER — Ambulatory Visit (HOSPITAL_COMMUNITY)
Admission: RE | Admit: 2023-06-12 | Discharge: 2023-06-12 | Disposition: A | Discharge status: Home / Self Care | Source: Ambulatory Visit | Attending: Nurse Practitioner | Admitting: Nurse Practitioner

## 2023-06-12 DIAGNOSIS — M810 Age-related osteoporosis without current pathological fracture: Secondary | ICD-10-CM | POA: Insufficient documentation

## 2023-06-12 DIAGNOSIS — Z1231 Encounter for screening mammogram for malignant neoplasm of breast: Secondary | ICD-10-CM | POA: Insufficient documentation

## 2023-06-12 DIAGNOSIS — Z780 Asymptomatic menopausal state: Secondary | ICD-10-CM | POA: Diagnosis not present

## 2023-09-05 DIAGNOSIS — M25561 Pain in right knee: Secondary | ICD-10-CM | POA: Diagnosis not present

## 2023-12-23 DIAGNOSIS — M1712 Unilateral primary osteoarthritis, left knee: Secondary | ICD-10-CM | POA: Diagnosis not present
